# Patient Record
Sex: Female | Born: 1935 | Race: Black or African American | Hispanic: No | Marital: Married | Smoking: Former smoker
Health system: Southern US, Community
[De-identification: ages and names within clinical notes are randomized; demographics above are authoritative.]

## PROBLEM LIST (undated history)

## (undated) DIAGNOSIS — D219 Benign neoplasm of connective and other soft tissue, unspecified: Secondary | ICD-10-CM

## (undated) DIAGNOSIS — K635 Polyp of colon: Secondary | ICD-10-CM

## (undated) HISTORY — DX: Polyp of colon: K63.5

## (undated) HISTORY — DX: Benign neoplasm of connective and other soft tissue, unspecified: D21.9

---

## 1960-02-27 HISTORY — PX: OTHER SURGICAL HISTORY: SHX169

## 1973-02-26 HISTORY — PX: ABDOMINAL HYSTERECTOMY: SHX81

## 1997-12-16 ENCOUNTER — Other Ambulatory Visit: Admission: RE | Admit: 1997-12-16 | Discharge: 1997-12-16 | Payer: Self-pay | Admitting: Obstetrics and Gynecology

## 1999-01-26 ENCOUNTER — Other Ambulatory Visit: Admission: RE | Admit: 1999-01-26 | Discharge: 1999-01-26 | Payer: Self-pay | Admitting: Obstetrics and Gynecology

## 2000-01-29 ENCOUNTER — Other Ambulatory Visit: Admission: RE | Admit: 2000-01-29 | Discharge: 2000-01-29 | Payer: Self-pay | Admitting: Obstetrics and Gynecology

## 2001-02-03 ENCOUNTER — Other Ambulatory Visit: Admission: RE | Admit: 2001-02-03 | Discharge: 2001-02-03 | Payer: Self-pay | Admitting: Obstetrics and Gynecology

## 2002-02-03 ENCOUNTER — Other Ambulatory Visit: Admission: RE | Admit: 2002-02-03 | Discharge: 2002-02-03 | Payer: Self-pay | Admitting: Obstetrics and Gynecology

## 2003-02-23 ENCOUNTER — Other Ambulatory Visit: Admission: RE | Admit: 2003-02-23 | Discharge: 2003-02-23 | Payer: Self-pay | Admitting: Obstetrics and Gynecology

## 2003-03-15 ENCOUNTER — Encounter (INDEPENDENT_AMBULATORY_CARE_PROVIDER_SITE_OTHER): Payer: Self-pay | Admitting: Specialist

## 2003-03-15 ENCOUNTER — Ambulatory Visit (HOSPITAL_COMMUNITY): Admission: RE | Admit: 2003-03-15 | Discharge: 2003-03-15 | Payer: Self-pay | Admitting: Gastroenterology

## 2003-03-19 ENCOUNTER — Ambulatory Visit (HOSPITAL_COMMUNITY): Admission: RE | Admit: 2003-03-19 | Discharge: 2003-03-19 | Payer: Self-pay | Admitting: Gastroenterology

## 2003-04-27 HISTORY — PX: COLON RESECTION: SHX5231

## 2003-05-14 ENCOUNTER — Inpatient Hospital Stay (HOSPITAL_COMMUNITY): Admission: RE | Admit: 2003-05-14 | Discharge: 2003-05-19 | Payer: Self-pay | Admitting: General Surgery

## 2003-05-14 ENCOUNTER — Encounter (INDEPENDENT_AMBULATORY_CARE_PROVIDER_SITE_OTHER): Payer: Self-pay | Admitting: Specialist

## 2004-02-24 ENCOUNTER — Other Ambulatory Visit: Admission: RE | Admit: 2004-02-24 | Discharge: 2004-02-24 | Payer: Self-pay | Admitting: Obstetrics and Gynecology

## 2004-04-10 ENCOUNTER — Encounter (INDEPENDENT_AMBULATORY_CARE_PROVIDER_SITE_OTHER): Payer: Self-pay | Admitting: Specialist

## 2004-04-10 ENCOUNTER — Ambulatory Visit (HOSPITAL_COMMUNITY): Admission: RE | Admit: 2004-04-10 | Discharge: 2004-04-10 | Payer: Self-pay | Admitting: Gastroenterology

## 2005-02-27 ENCOUNTER — Other Ambulatory Visit: Admission: RE | Admit: 2005-02-27 | Discharge: 2005-02-27 | Payer: Self-pay | Admitting: Obstetrics and Gynecology

## 2005-11-24 IMAGING — CT CT ABDOMEN W/ CM
1 series · 15 of 32 positions shown, 19 images · IV contrast (GASTRO & 100 ML OMNI)
Comparison: none

CLINICAL DATA: Villous adenoma in the cecum.
 CT OF THE ABDOMEN WITH CONTRAST
 Multidetector helical scans through the abdomen were performed after oral and IV contrast media were given.  100 cc of Omnipaque 300 were given as a contrast media.  
 The lung bases are clear.  There are multiple low attenuation structures scattered throughout the right and left lobes of the liver most consistent with incidental cysts statistically.  No ductal dilatation is seen.  The pancreas, adrenal glands, and spleen appear normal.  The kidneys enhance normally, and on delayed images the pelvocaliceal systems appear normal.  No adenopathy is seen.  The abdominal aorta is normal in caliber.
 IMPRESSION 
 Negative CT of the abdomen.
 CT PELVIS WITH CONTRAST
 Scans were continued through the pelvis after oral and IV contrast media were given.
 By history, the patient has a villous adenoma in the cecum.  There is minimal soft tissue in the anterolateral aspect of the cecum which may represent this villous adenoma.  However, the pericolonic fat planes are well preserved and no adenopathy is seen within the pelvis.  The urinary bladder is unremarkable.  No pelvic mass or adenopathy is seen in this patient who has previously undergone hysterectomy.  There are degenerative changes involving facet joints in the lower lumbar spine.
 Negative CT of the abdomen.  Minimal soft tissue is noted in the cecum where, by history, the patient has villous adenoma.  No adenopathy is noted.

[Series 2: abd pelvis · axial · 0.73mm/px · z∈[-415,-55]mm · 15 of 112 slices shown, 19 images]
[im 8/112  soft-tissue]
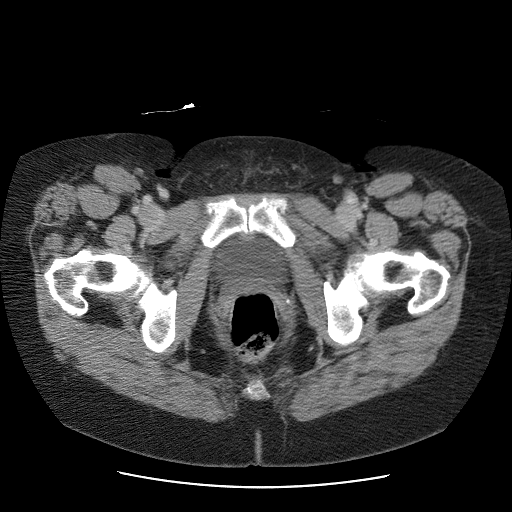
[im 8/112  bone]
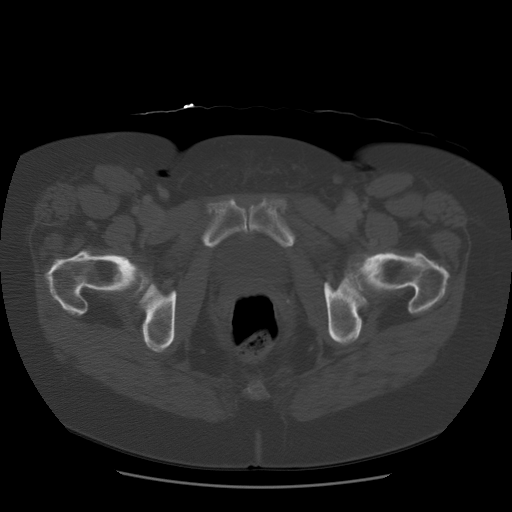
[im 15/112  soft-tissue]
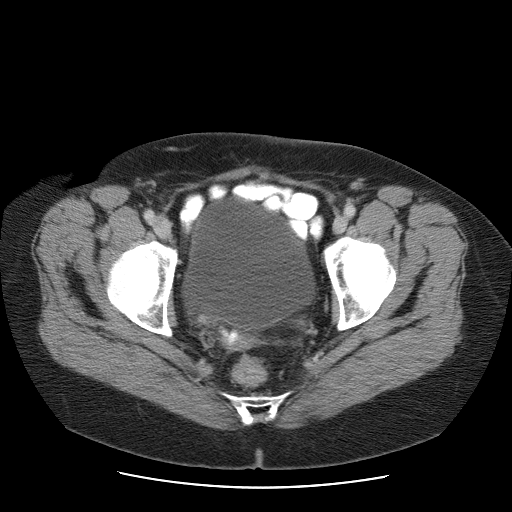
[im 22/112  soft-tissue]
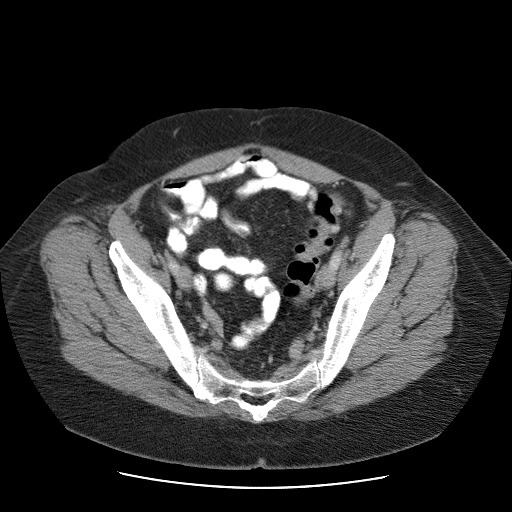
[im 33/112  soft-tissue]
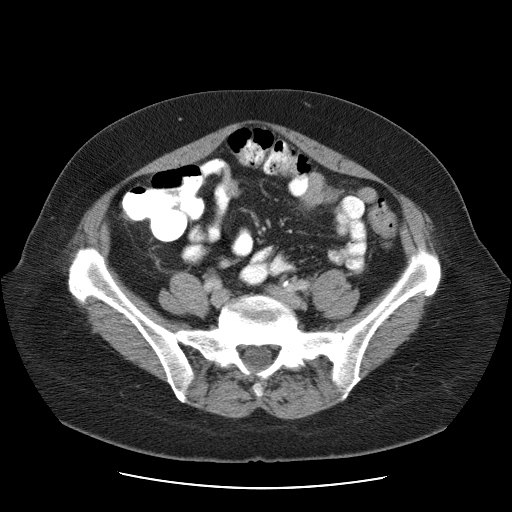
[im 40/112  soft-tissue]
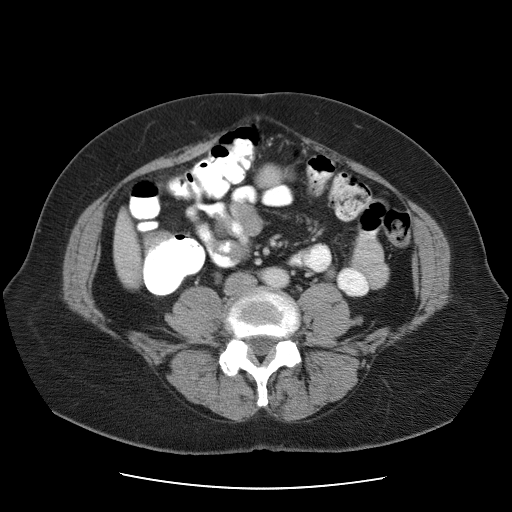
[im 47/112  soft-tissue]
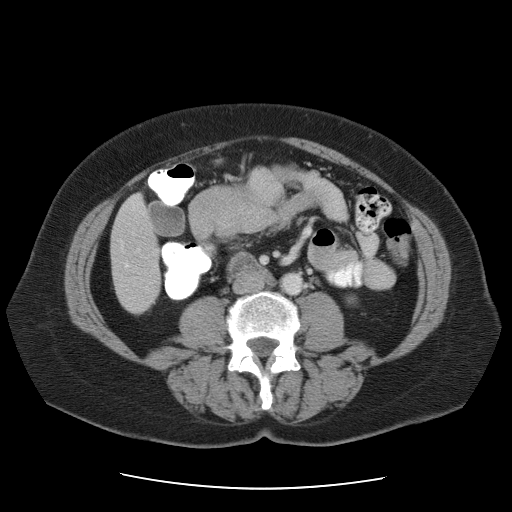
[im 58/112  soft-tissue]
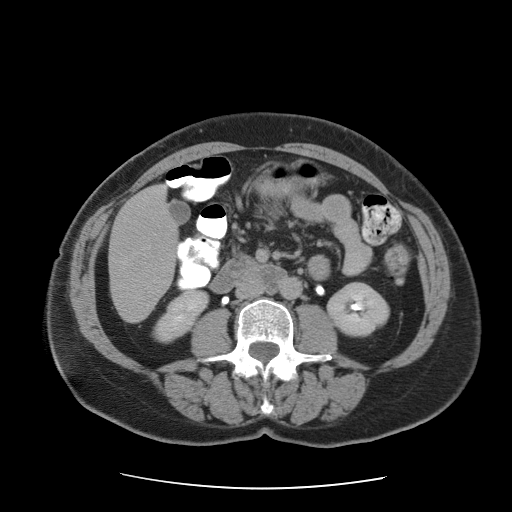
[im 65/112  soft-tissue]
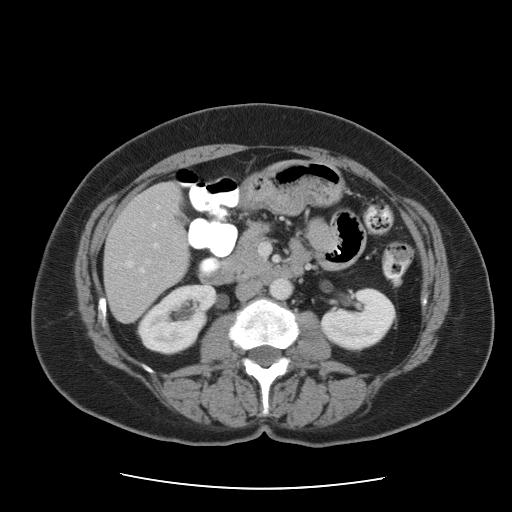
[im 72/112  soft-tissue]
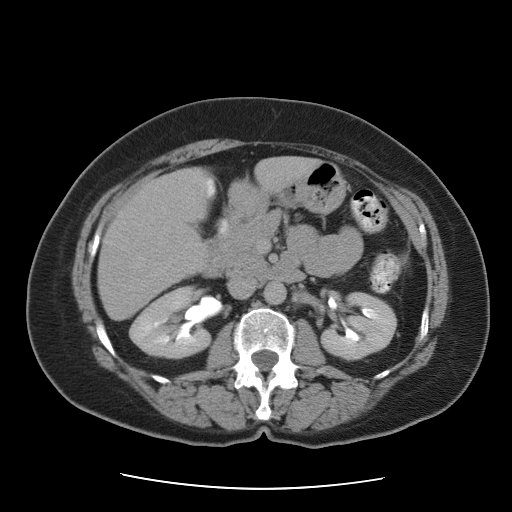
[im 72/112  bone]
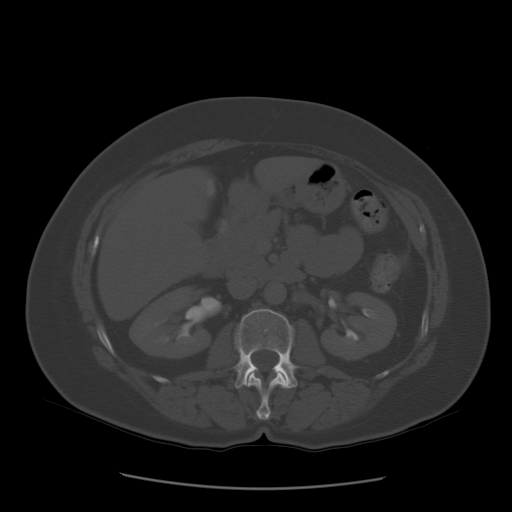
[im 79/112  soft-tissue]
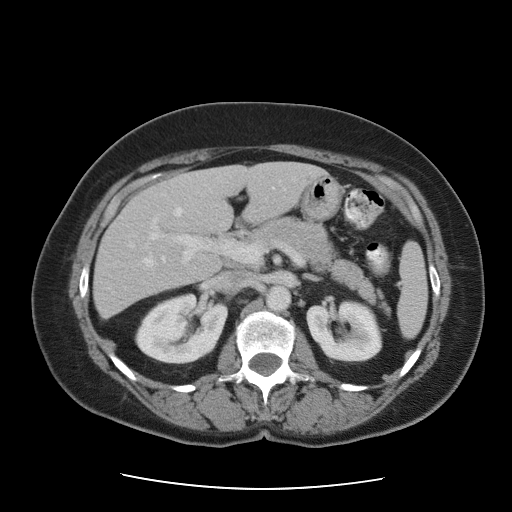
[im 90/112  soft-tissue]
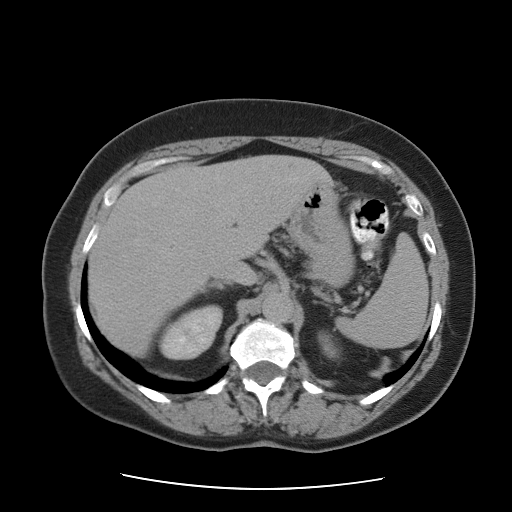
[im 97/112  soft-tissue]
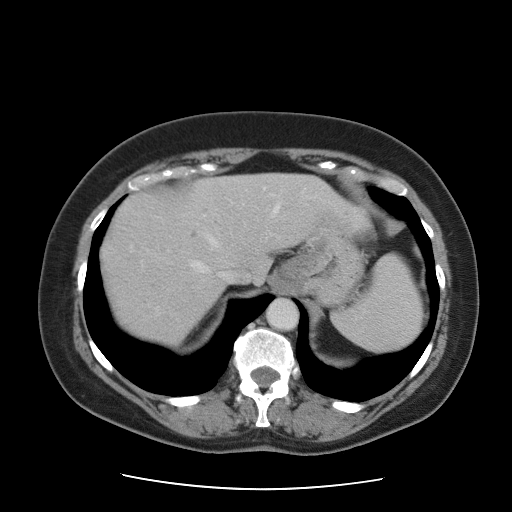
[im 97/112  lung]
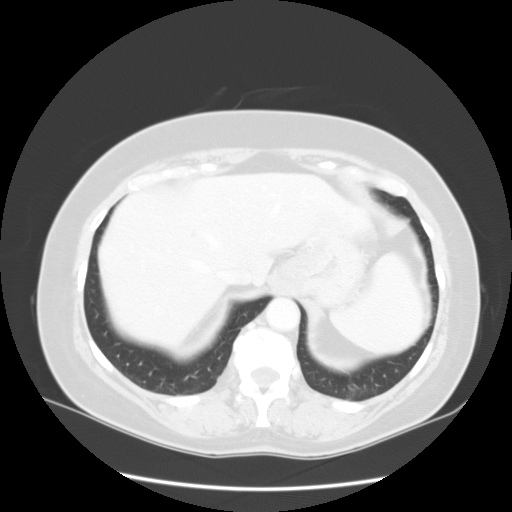
[im 101/112  lung]
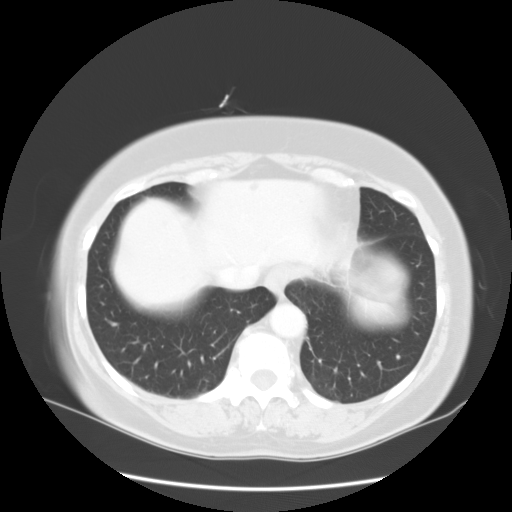
[im 104/112  soft-tissue]
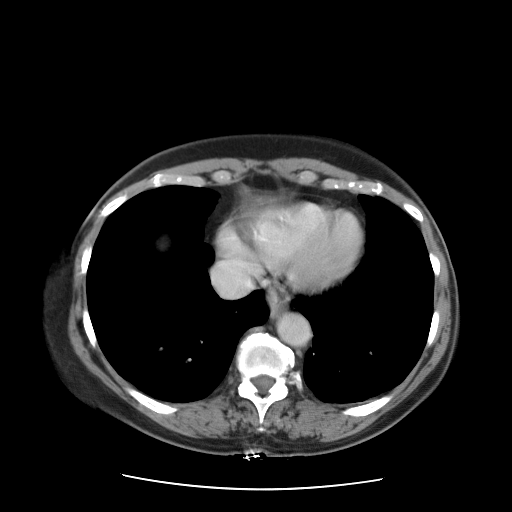
[im 104/112  lung]
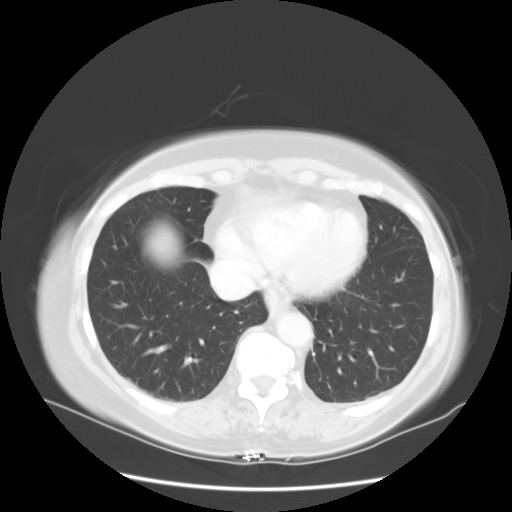
[im 108/112  lung]
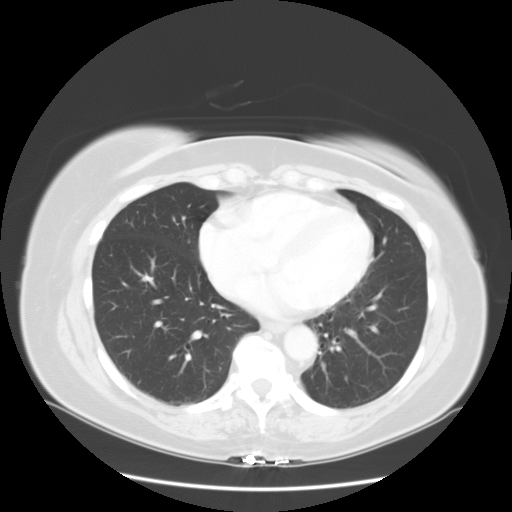

[15 of 32 positions shown; findings below may reference images not displayed]

## 2006-02-28 ENCOUNTER — Other Ambulatory Visit: Admission: RE | Admit: 2006-02-28 | Discharge: 2006-02-28 | Payer: Self-pay | Admitting: Obstetrics and Gynecology

## 2006-04-17 ENCOUNTER — Ambulatory Visit (HOSPITAL_COMMUNITY): Admission: RE | Admit: 2006-04-17 | Discharge: 2006-04-17 | Payer: Self-pay | Admitting: Gastroenterology

## 2007-03-04 ENCOUNTER — Other Ambulatory Visit: Admission: RE | Admit: 2007-03-04 | Discharge: 2007-03-04 | Payer: Self-pay | Admitting: Obstetrics and Gynecology

## 2008-02-18 ENCOUNTER — Emergency Department (HOSPITAL_COMMUNITY): Admission: EM | Admit: 2008-02-18 | Discharge: 2008-02-18 | Payer: Self-pay | Admitting: Emergency Medicine

## 2008-02-19 ENCOUNTER — Emergency Department (HOSPITAL_COMMUNITY): Admission: EM | Admit: 2008-02-19 | Discharge: 2008-02-19 | Payer: Self-pay | Admitting: Family Medicine

## 2008-03-04 ENCOUNTER — Encounter: Payer: Self-pay | Admitting: Obstetrics and Gynecology

## 2008-03-04 ENCOUNTER — Ambulatory Visit: Payer: Self-pay | Admitting: Obstetrics and Gynecology

## 2008-03-04 ENCOUNTER — Other Ambulatory Visit: Admission: RE | Admit: 2008-03-04 | Discharge: 2008-03-04 | Payer: Self-pay | Admitting: Obstetrics and Gynecology

## 2008-08-16 ENCOUNTER — Ambulatory Visit: Payer: Self-pay | Admitting: Obstetrics and Gynecology

## 2009-03-25 ENCOUNTER — Ambulatory Visit: Payer: Self-pay | Admitting: Obstetrics and Gynecology

## 2009-03-25 ENCOUNTER — Other Ambulatory Visit: Admission: RE | Admit: 2009-03-25 | Discharge: 2009-03-25 | Payer: Self-pay | Admitting: Obstetrics and Gynecology

## 2009-05-31 ENCOUNTER — Ambulatory Visit: Payer: Self-pay | Admitting: Obstetrics and Gynecology

## 2010-03-27 ENCOUNTER — Other Ambulatory Visit: Payer: Self-pay | Admitting: Obstetrics and Gynecology

## 2010-03-27 ENCOUNTER — Ambulatory Visit
Admission: RE | Admit: 2010-03-27 | Discharge: 2010-03-27 | Payer: Self-pay | Source: Home / Self Care | Attending: Obstetrics and Gynecology | Admitting: Obstetrics and Gynecology

## 2010-03-27 ENCOUNTER — Other Ambulatory Visit (HOSPITAL_COMMUNITY)
Admission: RE | Admit: 2010-03-27 | Discharge: 2010-03-27 | Disposition: A | Discharge status: Home / Self Care | Payer: BC Managed Care – PPO | Source: Ambulatory Visit | Attending: Obstetrics and Gynecology | Admitting: Obstetrics and Gynecology

## 2010-03-27 DIAGNOSIS — Z124 Encounter for screening for malignant neoplasm of cervix: Secondary | ICD-10-CM | POA: Insufficient documentation

## 2010-07-14 NOTE — Op Note (Signed)
NAME:  Sarah Carr, Sarah Carr                ACCOUNT NO.:  [id number]   MEDICAL RECORD NO.:  [id number]          PATIENT TYPE:  AMB   LOCATION:  ENDO                         FACILITY:  [facility]   PHYSICIAN:  Anselmo Rod, M.D.  DATE OF BIRTH:  21-Oct-1935   DATE OF PROCEDURE:  04/10/2004  DATE OF DISCHARGE:                                 OPERATIVE REPORT   PROCEDURE PERFORMED:  Colonoscopy with biopsies (cold biopsies).   ENDOSCOPIST:  Anselmo Rod, M.D.   INSTRUMENT USED:  Olympus video colonoscope.   INDICATION FOR PROCEDURE:  A 75 year old African-American female with a  history of a tubulovillous adenoma removed from the cecum by right  hemicolectomy done laparoscopically, undergoing repeat colonoscopy to rule  out recurrent polyps.   PREPROCEDURE PREPARATION:  Informed consent was procured from the patient.  The patient was fasted for eight hours prior to the procedure and prepped  with a bottle of magnesium citrate and a gallon of GoLYTELY the night prior  to the procedure.  The risks and benefits of the procedure, including a 10%  miss rate of cancer and polyps, were discussed with the patient as well.   PREPROCEDURE PHYSICAL:  VITAL SIGNS:  The patient had stable vital signs.  NECK:  Supple.  CHEST:  Clear to auscultation.  S1, S2 regular.  ABDOMEN:  Soft with normal bowel sounds, with a well-healed surgical scar  present from previous laparoscopic surgery.   DESCRIPTION OF PROCEDURE:  The patient was placed in the left lateral  decubitus position and sedated with 70 mg of Demerol and 75 mg of Versed in  slow incremental doses.  Once the patient was adequately sedate and  maintained on low-flow oxygen and continuous cardiac monitoring, the Olympus  video colonoscope was advanced from the rectum to the anastomosis without  difficulty.  There was evidence of sigmoid diverticulosis with inspissated  stool in some of the diverticula.  Three small sessile polyps were biopsied  from the rectosigmoid colon (cold biopsies).  The rest of the exam was  unrevealing.  The rest of the anastomosis appeared healthy until the distal  small bowel.   IMPRESSION:  1.  Sigmoid diverticulosis.  2.  Three small sessile polyps removed by cold biopsy from rectosigmoid      colon.  3.  Healthy anastomosis with normal distal small bowel.   RECOMMENDATIONS:  1.  Await pathology results.  2.  Repeat colonoscopy depending on pathology results.  3.  A high-fiber diet with liberal fluid intake has been advocated,      brochures on diverticulosis have been given to the patient for her      education.  4.  Outpatient follow-up as the need arises in the future.      JNM/MEDQ  D:  04/10/2004  T:  04/10/2004  Job:  [id number]   cc:   Lorelle Formosa, M.D.  [phone]  [place]  [place] [address]  Fax: [phone]   Adolph Pollack, M.D.  [address], [address]  [place]  [place] [address]

## 2010-07-14 NOTE — H&P (Signed)
Sarah Carr, Sarah Carr                            ACCOUNT NO.:  [id number]   MEDICAL RECORD NO.:  [id number]                   PATIENT TYPE:  INP   LOCATION:  0480                                 FACILITY:  [facility]   PHYSICIAN:  Adolph Pollack, M.D.            DATE OF BIRTH:  1935-11-13   DATE OF ADMISSION:  05/14/2003  DATE OF DISCHARGE:                                HISTORY & PHYSICAL   REASON FOR ADMISSION:  Elective partial colectomy.   HISTORY OF PRESENT ILLNESS:  Sarah Carr is a 75 year old female who  underwent a screening colonoscopy.  A nodular lesion in the cecum was  identified and a biopsy was performed.  It demonstrated some tubulovillous  adenoma. However, it was not able to be completely removed and it was  recommended she undergo partial colectomy.  She thus presents for that at  this time.   PAST MEDICAL HISTORY:  No chronic illnesses.   PAST SURGICAL HISTORY:  Cesarean section, partial hysterectomy, knee  surgery.   ALLERGIES:  PENICILLIN causes hives.   MEDICATIONS:  She is on no prescription medications but does take vitamins.   SOCIAL HISTORY:  No tobacco use. Occasional alcohol use. 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   FAMILY HISTORY:  Positive for heart disease.   REVIEW OF SYMPTOMS:  No cardiac or pulmonary problems.  No significant  pertinent positives.   PHYSICAL EXAMINATION:  GENERAL:  A well-developed, well-nourished female who  is in no acute distress. She is very pleasant and cooperative.  NECK:  Supple without palpable masses.  RESPIRATORY:  Breath sounds equal and clear.  Respirations are unlabored.  CARDIOVASCULAR:  Heart demonstrates a regular rate and rhythm.  There are no  murmurs heard. No JVD noted.  ABDOMEN:  Soft.  There is a small lower midline scar present without  hernias.  No palpable masses or organomegaly noted.  EXTREMITIES:  PAS hose are on here in the holding room.   IMPRESSION:   Tubulovillous adenoma of the colon that is incompletely  removed.   PLAN:  Laparoscopic assisted right colectomy. The procedure and the risks  which had been discussed with her she seems to understand and agrees to  proceed.                                              Adolph Pollack, M.D.   Sarah Carr  D:  05/14/2003  T:  05/15/2003  Job:  [id number]

## 2010-07-14 NOTE — Discharge Summary (Signed)
NAMEELISAMA, Sarah Carr                            ACCOUNT NO.:  [id number]   MEDICAL RECORD NO.:  [id number]                   PATIENT TYPE:  INP   LOCATION:  0480                                 FACILITY:  [facility]   PHYSICIAN:  Adolph Pollack, M.D.            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   SECONDARY DIAGNOSIS:  Mild postoperative ileus.   REASON FOR ADMISSION:  This is a 75 year old female who underwent a  screening colonoscopy and a nodular lesion in the cecum was noted and  biopsied.  There was some tubulovillous changes consistent with  tubulovillous adenoma.  However, it was unable to removed completely and she  now presents for elective resection.   HOSPITAL COURSE:  She underwent a laparoscopic assisted right colectomy  which she tolerated well.  Her Foley was removed.  She had a mild ileus, but  was able to be started on a diet and advanced along.  Her ileus resolved by  her fourth postoperative day with bowel movements and flatus.  Incision was  clean and intact.  By her fifth postoperative day the pathology came back as  above with no evidence of malignancy.  She was stable and ready for  discharge.   DISPOSITION:  Discharged to home on May 19, 2003, in satisfactory  condition.   ACTIVITY:  She was given activity restrictions.   DISCHARGE MEDICATIONS:  Tylox for pain.   FOLLOWUP:  She is going to return to see me in about five days for staple  removal.                                               Adolph Pollack, M.D.    [name]  D:  05/31/2003  T:  06/01/2003  Job:  [id number]   cc:   Anselmo Rod, M.D.  [address]  [address]  [place]  [place] [address]  Fax: [phone]   Lorelle Formosa, M.D.  [phone] [address]  [place]  [place] [address]  Fax: [phone]

## 2010-07-14 NOTE — Op Note (Signed)
Sarah Carr, Sarah Carr                            ACCOUNT NO.:  [id number]   MEDICAL RECORD NO.:  [id number]                   PATIENT TYPE:  INP   LOCATION:  0003                                 FACILITY:  [facility]   PHYSICIAN:  Adolph Pollack, M.D.            DATE OF BIRTH:  [birth year]   DATE OF PROCEDURE:  05/14/2003  DATE OF DISCHARGE:                                 OPERATIVE REPORT   PREOPERATIVE DIAGNOSIS:  Tubovillous adenoma of the cecum.   POSTOPERATIVE DIAGNOSIS:  Tubovillous adenoma of the cecum.   OPERATION/PROCEDURE:  Laparoscopic-assisted right colectomy.   SURGEON:  Adolph Pollack, M.D.   ASSISTANT:  Abigail Miyamoto, M.D.   ANESTHESIA:  General.   INDICATIONS:  This is a 75 year old female who underwent a screening  colonoscopy and was found to have a nodular lesion in the cecum that could  only be biopsied but not completely removed.  It demonstrated tubulovillous  adenoma.  Partial colectomy was recommended to her and she is now here for  that.   DESCRIPTION OF PROCEDURE:  She was seen in the holding area and then brought  to the operating room, placed supine on the operating room table and general  anesthesia was administered.  A Foley catheter was placed in the bladder.  The abdominal wall was then sterilely prepped and draped.  In the epigastric  region, a small incision was made just in the midline through the skin and  subcutaneous tissue to the midline fascia was identified and small incision  made in it.  Pursestring suture of 0 Vicryl was placed around the fascial  edges.  Using blunt dissection, the peritoneal cavity was entered and a  Hasson trocar was introduced into the peritoneal cavity.  A pneumoperitoneum  was then created by insufflation of CO2 gas.  A 30-degree laparoscope was  introduced.  Under direct vision a 5 mm trocar was placed in the left lower  mid abdominal wall and one in the lower mid to a part of the previous  incision.  I  identified the cecum.  I mobilized the cecum in the right colon  by dividing its lateral attachments with the Harmonic scalpel using careful  blunt dissection to sweep it medially.  Hepatic flexure was then approached  and it was mobilized using the Harmonic scalpel and divide the attachments.  I could mobilize all the way up to approximately the mid transverse colon.  It was mobile enough to be able to pulled down to the infraumbilical area.  I did not notice any liver lesions.   I then made an extraction site by extending the 5 mm incision in the lower  midline through the previous incision she had.  I opened this incision up  and entered the peritoneal cavity.  I then grasped the cecum and pulled the  cecum off the distal ileum.  I transected the distal ileum just proximal to  the ileocecal valve with an endo-GIA stapler.  I exteriorized the proximal  third of the transverse colon and divided the transverse colon just distal  to the hepatic flexure with the GIA stapler.  The mesenteric vessels were  then divided between clamps and  the vessels ligated.  A wedge of mesentery  was included with the specimen which was handed off the field.   Next, a side-to-side stapled anastomosis was performed between the distal  ileal area and the transverse colon by using the GIA stapler.  The remaining  opening was closed with a linear, noncutting stapler.  The anastomosis was  patent, viable and under no tension.  The distal aspect of the staple line  was reinforced with a single 3-0 Vicryl suture.   Gloves were then changed.  The abdominal cavity was irrigated and the fluid  was clear.  After needle, sponge and instrument counts were reported to be  correct for the first time, I closed the extraction site incision in the  lower midline.  The fascia was closed with a running #1 PDS suture.  Subcutaneous tissue was irrigated and skin was closed with staples.   I then reinsufflated the abdomen and  examined the area.  No bleeding was  noted and closure in the lower mid was solid.  The laparoscopic was removed,  the pneumoperitoneum released.  The trocars were removed.  The epigastric  fascial defect was closed by tightening up and tying down the pursestring  suture and all skin incisions were closed with staples.  Sterile dressings  were applied.   She tolerated the procedure well without any apparent complications.  She  was taken to the recovery room in satisfactory condition.                                               Adolph Pollack, M.D.    [name]  D:  05/14/2003  T:  05/14/2003  Job:  [id number]   cc:   Anselmo Rod, M.D.  [address]  [address]  [place]  [place] [address]  Fax: [phone]

## 2010-07-14 NOTE — Op Note (Signed)
NAME:  Sarah Carr, Sarah Carr                            ACCOUNT NO.:  [id number]   MEDICAL RECORD NO.:  [id number]                   PATIENT TYPE:  AMB   LOCATION:  ENDO                                 FACILITY:  [facility]   PHYSICIAN:  Anselmo Rod, M.D.               DATE OF BIRTH:  12/29/1935   DATE OF PROCEDURE:  03/16/2003  DATE OF DISCHARGE:                                 OPERATIVE REPORT   PROCEDURE:  Screening colonoscopy with biopsies.   ENDOSCOPIST:  Charna Elizabeth, M.D.   INSTRUMENT USED:  Olympus video colonoscope.   INDICATIONS FOR PROCEDURE:  75 year old African American female undergoing  screening colonoscopy to rule out colonic polyps, masses, etc.   PREPROCEDURE PREPARATION:  Informed consent was obtained from the patient.  The patient was fasted for eight hours prior to the procedure and prepped  with a bottle of magnesium citrate and a gallon of GoLYTELY the night prior  to the procedure.   PREPROCEDURE PHYSICAL:  Patient with stable vital signs.  Neck supple.  Chest clear to auscultation.  S1 and S2 regular.  Abdomen soft with normal  bowel sounds.   DESCRIPTION OF PROCEDURE:  The patient was placed in the left lateral  decubitus position, sedated with 60 mg of Demerol and 6 mg Versed  intravenously.  Once the patient was adequately sedated, maintained on low  flow oxygen and continuous cardiac monitoring, the Olympus video colonoscope  was advanced from the rectum to the cecum.  A nodular flat mass was seen in  the cecum.  Multiple biopsies were done to rule out villous adenoma.  There  were a few sigmoid diverticula noted and small internal hemorrhoids seen on  retroflexion.  The transverse colon appeared healthy and without lesions.  The patient tolerated the procedure well without complications.  The  appendiceal orifice and ileocecal valve were clearly visualized and  photographed.   IMPRESSION:  1. Small nonbleeding internal hemorrhoids.  2. Sigmoid  diverticulosis.  3. Flat, nodular lesion in the cecum, question villous adenoma, multiple     biopsies done.   RECOMMENDATIONS:  1. Await pathology results, surgical evaluation for right hemicolectomy with     Dr. Abigail Miyamoto.  2. High fiber diet with liberal fluid intake.  3. Brochures on diverticulosis have been given to the patient.  4. Further recommendations will be made in follow up.                                               Anselmo Rod, M.D.    JNM/MEDQ  D:  03/16/2003  T:  03/16/2003  Job:  [id number]   cc:   Romualdo Bolk, M.D.   Abigail Miyamoto, M.D.  [address]  [place]  [place] [address]  Fax: [phone]

## 2010-07-14 NOTE — Op Note (Signed)
NAME:  Sarah Carr, Sarah Carr                ACCOUNT NO.:  [id number]   MEDICAL RECORD NO.:  [id number]          PATIENT TYPE:  AMB   LOCATION:  ENDO                         FACILITY:  [facility]   PHYSICIAN:  Anselmo Rod, M.D.  DATE OF BIRTH:  February 19, 1936   DATE OF PROCEDURE:  04/17/2006  DATE OF DISCHARGE:                               OPERATIVE REPORT   PROCEDURE:  Colonoscopy with snare polypectomy x1.   ENDOSCOPIST:  Anselmo Rod, M.D.   INSTRUMENT USED:  Pentax video colonoscope.   INDICATIONS FOR PROCEDURE:  This 75 year old African/American female  with a history of a tubulovillous adenoma (cecal mass), removed by a  right hemicolectomy in 2005, undergoing a surveillance colonoscopy to  rule out colonic polyps, masses, etc.   PRE-PROCEDURE PREPARATION:  An informed consent was procured from the  patient.  The patient fasted for four hours prior to the procedure and  was prepped with 20 ounces of prep during the night and 12 ounces of  prep on the morning of the procedure.  The risks and benefits of the  procedure, including a 10% mid-rate of polyp were discussed with the  patient as well.   PRE-PROCEDURE PHYSICAL EXAMINATION:  VITAL SIGNS:  Stable.  NECK:  Supple.  CHEST:  Clear to auscultation.  HEART:  S1 and S2 regular.  ABDOMEN:  Soft, normal bowel sounds.   DESCRIPTION OF PROCEDURE:  The patient was placed in the left lateral  decubitus position and sedated with 60 mcg of fentanyl and 5 mg of  Versed given intravenously in slow incremental doses. Once the patient  was adequately sedated and maintained on low-flow oxygen and continuous  cardiac monitoring, the Pentax video colonoscope was advanced from the  rectum to the anastomosis at 100 cm, without difficulty.  There was some  residual stool in the colon.  Multiple washings were done.  Sigmoid  diverticulosis was noted.  A small sessile polyp was snared from 30 cm.  This polyp was lost in stool.  The rest of the  examination was  unremarkable.  Retroflexion in the rectum revealed no abnormalities.   The patient tolerated the procedure well without any immediate  complications.   IMPRESSION:  1. Small sessile polyp removed by a hard snare from 30 cm.  2. Sigmoid diverticulosis.  3. Status post right hemicolectomy; a healthy anastomosis noted at 100      cm.   RECOMMENDATIONS:  1. Repeat colonoscopy has been recommended in the next five years.  2. If the patient has any abnormal symptoms in the interim, she should      contact the office immediately for further recommendations.  3. A high fiber diet.  Brochures on diverticulosis have been given to      the patient for education.  4. Avoid all non-steroidal anti-inflammatories including aspirin, for      the next two weeks.  5. Outpatient followup as the need arises in the future.      Anselmo Rod, M.D.  Electronically Signed     JNM/MEDQ  D:  04/17/2006  T:  04/17/2006  Job:  [id number]   cc:   Adolph Pollack, M.D.  Lorelle Formosa, M.D.  Daniel L. Eda Paschal, M.D.

## 2010-08-03 ENCOUNTER — Ambulatory Visit (HOSPITAL_COMMUNITY)
Admission: RE | Admit: 2010-08-03 | Discharge: 2010-08-03 | Disposition: A | Discharge status: Home / Self Care | Payer: BC Managed Care – PPO | Source: Ambulatory Visit | Attending: Family Medicine | Admitting: Family Medicine

## 2010-08-03 ENCOUNTER — Other Ambulatory Visit (HOSPITAL_COMMUNITY): Payer: Self-pay | Admitting: Family Medicine

## 2010-08-03 DIAGNOSIS — W19XXXA Unspecified fall, initial encounter: Secondary | ICD-10-CM

## 2010-08-03 DIAGNOSIS — M19049 Primary osteoarthritis, unspecified hand: Secondary | ICD-10-CM | POA: Insufficient documentation

## 2010-08-03 DIAGNOSIS — M25539 Pain in unspecified wrist: Secondary | ICD-10-CM | POA: Insufficient documentation

## 2010-11-18 ENCOUNTER — Inpatient Hospital Stay (INDEPENDENT_AMBULATORY_CARE_PROVIDER_SITE_OTHER)
Admission: RE | Admit: 2010-11-18 | Discharge: 2010-11-18 | Disposition: A | Discharge status: Home / Self Care | Payer: BC Managed Care – PPO | Source: Ambulatory Visit | Attending: Family Medicine | Admitting: Family Medicine

## 2010-11-18 ENCOUNTER — Ambulatory Visit (INDEPENDENT_AMBULATORY_CARE_PROVIDER_SITE_OTHER): Payer: BC Managed Care – PPO

## 2010-11-18 DIAGNOSIS — M799 Soft tissue disorder, unspecified: Secondary | ICD-10-CM

## 2011-03-20 DIAGNOSIS — D219 Benign neoplasm of connective and other soft tissue, unspecified: Secondary | ICD-10-CM | POA: Insufficient documentation

## 2011-03-23 ENCOUNTER — Encounter: Payer: Self-pay | Admitting: Obstetrics and Gynecology

## 2011-04-12 ENCOUNTER — Encounter: Payer: Self-pay | Admitting: Obstetrics and Gynecology

## 2011-04-12 ENCOUNTER — Other Ambulatory Visit: Payer: Self-pay | Admitting: Obstetrics and Gynecology

## 2011-04-12 ENCOUNTER — Other Ambulatory Visit (HOSPITAL_COMMUNITY)
Admission: RE | Admit: 2011-04-12 | Discharge: 2011-04-12 | Disposition: A | Discharge status: Home / Self Care | Payer: BC Managed Care – PPO | Source: Ambulatory Visit | Attending: Obstetrics and Gynecology | Admitting: Obstetrics and Gynecology

## 2011-04-12 ENCOUNTER — Ambulatory Visit (INDEPENDENT_AMBULATORY_CARE_PROVIDER_SITE_OTHER): Payer: BC Managed Care – PPO | Admitting: Obstetrics and Gynecology

## 2011-04-12 VITALS — BP 120/76 | Ht 63.5 in | Wt 182.0 lb

## 2011-04-12 DIAGNOSIS — Z124 Encounter for screening for malignant neoplasm of cervix: Secondary | ICD-10-CM | POA: Insufficient documentation

## 2011-04-12 DIAGNOSIS — K635 Polyp of colon: Secondary | ICD-10-CM | POA: Insufficient documentation

## 2011-04-12 DIAGNOSIS — Z01419 Encounter for gynecological examination (general) (routine) without abnormal findings: Secondary | ICD-10-CM

## 2011-04-12 LAB — URINALYSIS W MICROSCOPIC + REFLEX CULTURE
Bilirubin Urine: NEGATIVE
Casts: NONE SEEN
Glucose, UA: NEGATIVE mg/dL
Protein, ur: NEGATIVE mg/dL
Urobilinogen, UA: 0.2 mg/dL (ref 0.0–1.0)
pH: 5.5 (ref 5.0–8.0)

## 2011-04-12 NOTE — Progress Notes (Signed)
Patient came to see me today for her annual GYN exam. She is up-to-date on mammograms and bone densities. She is having no gynecological problems. All her lab work was normal last year. She has had no non-GYN issues. She is scheduled for colonoscopy. She had Doppler of her carotids and her abdomen for like screening yesterday. She is having no pelvic pain. She is having no vaginal bleeding.  ROS: 12 system review done. All is normal.  Physical examination: Kennon Portela present HEENT within normal limits. Neck: Thyroid not large. No masses. Supraclavicular nodes: not enlarged. Breasts: Examined in both sitting midline position. No skin changes and no masses. Abdomen: Soft no guarding rebound or masses or hernia. Pelvic: External: Within normal limits. BUS: Within normal limits. Vaginal:within normal limits. Good estrogen effect. No evidence of cystocele rectocele or enterocele. Cervix: clean. Uterus: Normal size and shape. Adnexa: No masses. Rectovaginal exam: Confirmatory and negative. Extremities: Within normal limits.  Assessment: Normal GYN exam  Plan: Continue yearly mammogram.

## 2011-04-14 LAB — URINE CULTURE
Colony Count: NO GROWTH
Organism ID, Bacteria: NO GROWTH

## 2012-04-15 ENCOUNTER — Encounter: Payer: Self-pay | Admitting: Obstetrics and Gynecology

## 2012-04-15 ENCOUNTER — Ambulatory Visit: Payer: BC Managed Care – PPO | Admitting: Obstetrics and Gynecology

## 2012-04-15 VITALS — BP 100/60 | HR 68 | Ht 63.75 in | Wt 171.0 lb

## 2012-04-15 DIAGNOSIS — Z833 Family history of diabetes mellitus: Secondary | ICD-10-CM

## 2012-04-15 DIAGNOSIS — Z9049 Acquired absence of other specified parts of digestive tract: Secondary | ICD-10-CM

## 2012-04-15 DIAGNOSIS — R350 Frequency of micturition: Secondary | ICD-10-CM

## 2012-04-15 DIAGNOSIS — K635 Polyp of colon: Secondary | ICD-10-CM

## 2012-04-15 DIAGNOSIS — R3129 Other microscopic hematuria: Secondary | ICD-10-CM

## 2012-04-15 DIAGNOSIS — Z8249 Family history of ischemic heart disease and other diseases of the circulatory system: Secondary | ICD-10-CM

## 2012-04-15 LAB — POCT URINALYSIS DIPSTICK
Ketones, UA: NEGATIVE
Leukocytes, UA: NEGATIVE
Nitrite, UA: NEGATIVE
Protein, UA: NEGATIVE
Spec Grav, UA: 1.01
pH, UA: 5

## 2012-04-15 NOTE — Progress Notes (Signed)
Subjective:  NEW PT AEXLast Pap: 02/2011 WNL: Yes Regular Periods:no Contraception: hysterectomy/postmenopausal   Monthly Breast exam:no some months Tetanus<60yrs:no Nl.Bladder Function: leaking of urine with exercise.  Urinary frequency.  Nocturia X2 Daily BMs:yes Healthy Diet:yes Calcium:yes Mammogram:yes Date of Mammogram: 02/2012 per pt Exercise:yes Have often Exercise: twice per week  Seatbelt: yes Abuse at home: no Stressful work:no Sigmoid-colonoscopy: 2013 per pt Bone Density: Yes ? DATE PCP: Dr. Billee Cashing  Change in PMH: n/a Change in FMH:n/a   Sarah Carr is a 77 y.o. female [phone] who presents for annual exam.  The patient has no complaints today.   The following portions of the patient's history were reviewed and updated as appropriate: allergies, current medications, past family history, past medical history, past social history, past surgical history and problem list.  Review of Systems Pertinent items are noted in HPI. Gastrointestinal:No change in bowel habits, no abdominal pain, no rectal bleeding Genitourinary:negative for dysuria, hematuria, Has frequency (especially with current large water intake ) as well asnocturia and urinary incontinence  with exercise.   Objective:     BP 100/60  Pulse 68  Ht 5' 3.75" (1.619 m)  Wt 171 lb (77.565 kg)  BMI 29.59 kg/m2  Weight:  Wt Readings from Last 1 Encounters:  04/15/12 171 lb (77.565 kg)     BMI: Body mass index is 29.59 kg/(m^2). General Appearance: Alert, appropriate appearance for age. No acute distress HEENT: Grossly normal Neck / Thyroid: Supple, no masses, nodes or enlargement Lungs: clear to auscultation bilaterally Back: No CVA tenderness Breast Exam: No masses or nodes.No dimpling, nipple retraction or discharge. Cardiovascular: Regular rate and rhythm. S1, S2, no murmur Gastrointestinal: Soft, non-tender, no masses or organomegaly Pelvic Exam: External genitalia: normal general  appearance Vaginal: atrophic mucosa and vaginal vault, well healed and suspended Cervix: removed surgically Adnexa: non palpable Uterus: removed surgically Rectovaginal: normal rectal, no masses Lymphatic Exam: Non-palpable nodes in neck, clavicular, axillary, or inguinal regions Skin: no rash or abnormalities Neurologic: Normal gait and speech, no tremor  Psychiatric: Alert and oriented, appropriate affect.    Urinalysis:2+ blood    Assessment:    Menopause Microscopic hematuria  Frequency, incontinence R/o UTI Family hx heart dz S/p partial colon resection for polyps   Plan:   mammogram counseled on breast self exam, mammography screening and osteoporosis Needs DXA Q2 years RTO for fasting CMP, CBC, Lipids return annually or prn    Dierdre Forth MD

## 2012-04-16 ENCOUNTER — Other Ambulatory Visit: Payer: BC Managed Care – PPO

## 2012-04-16 LAB — LIPID PANEL
Cholesterol: 195 mg/dL (ref 0–200)
HDL: 63 mg/dL (ref >39)
LDL Cholesterol: 124 mg/dL — ABNORMAL HIGH (ref 0–99)
Total CHOL/HDL Ratio: 3.1 Ratio
VLDL: 8 mg/dL (ref 0–40)

## 2012-04-16 LAB — COMPREHENSIVE METABOLIC PANEL
ALT: 11 U/L (ref 0–35)
AST: 18 U/L (ref 0–37)
Alkaline Phosphatase: 76 U/L (ref 39–117)
BUN: 17 mg/dL (ref 6–23)
CO2: 28 mEq/L (ref 19–32)
Calcium: 9 mg/dL (ref 8.4–10.5)
Chloride: 105 mEq/L (ref 96–112)
Creat: 0.92 mg/dL (ref 0.50–1.10)
Glucose, Bld: 97 mg/dL (ref 70–99)
Sodium: 139 mEq/L (ref 135–145)
Total Bilirubin: 0.5 mg/dL (ref 0.3–1.2)
Total Protein: 6.3 g/dL (ref 6.0–8.3)

## 2012-04-16 LAB — CBC
HCT: 38.9 % (ref 36.0–46.0)
Hemoglobin: 13.2 g/dL (ref 12.0–15.0)
MCH: 28.6 pg (ref 26.0–34.0)
MCV: 84.2 fL (ref 78.0–100.0)
Platelets: 233 10*3/uL (ref 150–400)
RBC: 4.62 MIL/uL (ref 3.87–5.11)
RDW: 14 % (ref 11.5–15.5)

## 2012-04-17 LAB — URINE CULTURE: Organism ID, Bacteria: NO GROWTH

## 2012-04-24 ENCOUNTER — Encounter: Payer: Self-pay | Admitting: Obstetrics and Gynecology

## 2012-04-24 DIAGNOSIS — R922 Inconclusive mammogram: Secondary | ICD-10-CM

## 2012-10-01 ENCOUNTER — Other Ambulatory Visit: Payer: Self-pay

## 2013-01-01 ENCOUNTER — Other Ambulatory Visit: Payer: Self-pay

## 2013-12-11 ENCOUNTER — Other Ambulatory Visit: Payer: Self-pay

## 2013-12-28 ENCOUNTER — Encounter: Payer: Self-pay | Admitting: Obstetrics and Gynecology

## 2014-07-04 ENCOUNTER — Encounter (HOSPITAL_BASED_OUTPATIENT_CLINIC_OR_DEPARTMENT_OTHER): Payer: BC Managed Care – PPO

## 2014-08-23 ENCOUNTER — Other Ambulatory Visit: Payer: Self-pay

## 2014-09-08 ENCOUNTER — Encounter (HOSPITAL_BASED_OUTPATIENT_CLINIC_OR_DEPARTMENT_OTHER): Payer: BC Managed Care – PPO

## 2014-09-13 ENCOUNTER — Encounter (HOSPITAL_BASED_OUTPATIENT_CLINIC_OR_DEPARTMENT_OTHER): Payer: BC Managed Care – PPO

## 2014-11-24 ENCOUNTER — Ambulatory Visit (HOSPITAL_BASED_OUTPATIENT_CLINIC_OR_DEPARTMENT_OTHER): Payer: BC Managed Care – PPO | Attending: Family Medicine | Admitting: Radiology

## 2014-11-24 VITALS — Ht 65.0 in | Wt 163.0 lb

## 2014-11-24 DIAGNOSIS — G4730 Sleep apnea, unspecified: Secondary | ICD-10-CM

## 2014-11-25 NOTE — Sleep Study (Unsigned)
Pt was sick with a cold and stuffy nose and went home.

## 2017-05-16 ENCOUNTER — Other Ambulatory Visit: Payer: Self-pay

## 2017-05-16 ENCOUNTER — Ambulatory Visit (HOSPITAL_COMMUNITY)
Admission: EM | Admit: 2017-05-16 | Discharge: 2017-05-16 | Disposition: A | Discharge status: Home / Self Care | Payer: BC Managed Care – PPO | Attending: Family Medicine | Admitting: Family Medicine

## 2017-05-16 ENCOUNTER — Encounter (HOSPITAL_COMMUNITY): Payer: Self-pay | Admitting: Emergency Medicine

## 2017-05-16 DIAGNOSIS — J069 Acute upper respiratory infection, unspecified: Secondary | ICD-10-CM | POA: Diagnosis not present

## 2017-05-16 MED ORDER — SALINE SPRAY 0.65 % NA SOLN: 1.0000 | NASAL | 0 refills | Status: DC | PRN

## 2017-05-16 MED ORDER — FLUTICASONE PROPIONATE 50 MCG/ACT NA SUSP: 1.0000 | Freq: Every day | NASAL | 2 refills | Status: DC

## 2017-05-16 MED ORDER — ACETAMINOPHEN 325 MG PO TABS
975.0000 mg | ORAL_TABLET | Freq: Once | ORAL | Status: AC
  Administered 2017-05-16: 975 mg via ORAL

## 2017-05-16 MED ORDER — ACETAMINOPHEN 325 MG PO TABS
ORAL_TABLET | ORAL | Status: AC
  Filled 2017-05-16: qty 3

## 2017-05-16 MED ORDER — GUAIFENESIN ER 600 MG PO TB12: 1200.0000 mg | ORAL_TABLET | Freq: Two times a day (BID) | ORAL | 0 refills | Status: DC

## 2017-05-16 NOTE — ED Provider Notes (Signed)
[place]    CSN: [id number] Arrival date & time: 05/16/17  9563     History   Chief Complaint Chief Complaint  Patient presents with  . URI    HPI Sarah Carr is a 82 y.o. female.   Ajanae presents with complaints of sore throat, runny nose, ear popping, headache which started a few days ago, worse yesterday. ocasional cough.  No known ill contacts. No known fevers. Denies gi/gu complaints. Eating and drinking. Took theraflu last night which did seem to help. Without contributing medical history.   ROS per HPI.      Past Medical History:  Diagnosis Date  . Colon polyp   . Fibroid     Patient Active Problem List   Diagnosis Date Noted  . Dense breasts 04/24/2012  . S/P partial resection of colon 04/15/2012  . Family history of ischemic heart disease 04/15/2012  . Family history of diabetes mellitus 04/15/2012  . Colon polyp   . Fibroid     Past Surgical History:  Procedure Laterality Date  . ABDOMINAL HYSTERECTOMY  1975   C-SECTION  . CESAREAN SECTION  1975  . COLON RESECTION  04/2003   BENIGN POLYP  . EXCISION OF BONE TUMOR  1962    OB History    Gravida  4   Para  4   Term  4   Preterm      AB  0   Living  3     SAB      TAB      Ectopic      Multiple      Live Births               Home Medications    Prior to Admission medications   Medication Sig Start Date End Date Taking? Authorizing Provider  Calcium Carbonate-Vitamin D (CALCIUM + D PO) Take by mouth.   Yes [provider]  Cholecalciferol (VITAMIN D) 2000 UNITS CAPS Take by mouth.   Yes [provider]  Coenzyme Q10 (COQ-10 PO) Take by mouth.   Yes [provider]  LUMIGAN 0.01 % SOLN Apply 1 drop to eye Nightly. 03/06/17  Yes [provider]  Multiple Vitamin (MULTIVITAMIN) capsule Take 1 capsule by mouth daily.   Yes [provider]  fluticasone (FLONASE) 50 MCG/ACT nasal spray Place 1 spray into both  nostrils daily. 05/16/17   Zigmund Gottron, NP  guaiFENesin (MUCINEX) 600 MG 12 hr tablet Take 2 tablets (1,200 mg total) by mouth 2 (two) times daily. 05/16/17   Zigmund Gottron, NP  sodium chloride (OCEAN) 0.65 % SOLN nasal spray Place 1 spray into both nostrils as needed for congestion. 05/16/17   Zigmund Gottron, NP    Family History Family History  Problem Relation Age of Onset  . Diabetes Mother   . Cancer Mother        KIDNEY  . Breast cancer Maternal Aunt   . Liver cancer Maternal Aunt   . Leukemia Son     Social History Social History   Tobacco Use  . Smoking status: Former Research scientist (life sciences)  . Smokeless tobacco: Never Used  Substance Use Topics  . Alcohol use: Yes    Alcohol/week: 0.5 oz    Types: 1 Standard drinks or equivalent per week  . Drug use: No     Allergies   Penicillins   Review of Systems Review of Systems   Physical Exam Triage Vital Signs ED Triage  Vitals  Enc Vitals Group     BP 05/16/17 1024 129/77     Pulse Rate 05/16/17 1024 77     Resp --      Temp 05/16/17 1024 98.1 F (36.7 C)     Temp Source 05/16/17 1024 Oral     SpO2 05/16/17 1024 99 %     Weight --      Height --      Head Circumference --      Peak Flow --      Pain Score 05/16/17 1020 6     Pain Loc --      Pain Edu? --      Excl. in [place]? --    No data found.  Updated Vital Signs BP 129/77 (BP Location: Right Arm)   Pulse 77   Temp 98.1 F (36.7 C) (Oral)   SpO2 99%   Visual Acuity Right Eye Distance:   Left Eye Distance:   Bilateral Distance:    Right Eye Near:   Left Eye Near:    Bilateral Near:     Physical Exam  Constitutional: She is oriented to person, place, and time. She appears well-developed and well-nourished. No distress.  HENT:  Head: Normocephalic and atraumatic.  Right Ear: Tympanic membrane, external ear and ear canal normal.  Left Ear: Tympanic membrane, external ear and ear canal normal.  Nose: Mucosal edema and rhinorrhea present. Right sinus  exhibits no maxillary sinus tenderness and no frontal sinus tenderness. Left sinus exhibits no maxillary sinus tenderness and no frontal sinus tenderness.  Mouth/Throat: Uvula is midline, oropharynx is clear and moist and mucous membranes are normal. No tonsillar exudate.  Eyes: Pupils are equal, round, and reactive to light. Conjunctivae and EOM are normal.  Cardiovascular: Normal rate, regular rhythm and normal heart sounds.  Pulmonary/Chest: Effort normal and breath sounds normal.  Neurological: She is alert and oriented to person, place, and time.  Skin: Skin is warm and dry.     UC Treatments / Results  Labs (all labs ordered are listed, but only abnormal results are displayed) Labs Reviewed - No data to display  EKG  EKG Interpretation None       Radiology No results found.  Procedures Procedures (including critical care time)  Medications Ordered in UC Medications  acetaminophen (TYLENOL) tablet 975 mg (has no administration in time range)     Initial Impression / Assessment and Plan / UC Course  I have reviewed the triage vital signs and the nursing notes.  Pertinent labs & imaging results that were available during my care of the patient were reviewed by me and considered in my medical decision making (see chart for details).     Benign physical findings. Non toxic in appearance. History and physical consistent with viral illness.  Supportive cares recommended. If symptoms worsen or do not improve in the next week to return to be seen or to follow up with PCP.  Patient verbalized understanding and agreeable to plan.    Final Clinical Impressions(s) / UC Diagnoses   Final diagnoses:  Upper respiratory tract infection, unspecified type    ED Discharge Orders        Ordered    fluticasone (FLONASE) 50 MCG/ACT nasal spray  Daily     05/16/17 1035    sodium chloride (OCEAN) 0.65 % SOLN nasal spray  As needed     05/16/17 1035    guaiFENesin (MUCINEX) 600 MG  12 hr tablet  2 times  daily     05/16/17 1035       Controlled Substance Prescriptions [place] Controlled Substance Registry consulted? n/a   Zigmund Gottron, NP 05/16/17 1038

## 2017-05-16 NOTE — Discharge Instructions (Addendum)
Push fluids to ensure adequate hydration and keep secretions thin.  Tylenol and/or ibuprofen as needed for pain or fevers.  Daily flonase. Nasal spray throughout the day to help promote nasal drainage to relieve pressure. Mucinex as an expectorant may be help. Zyrtec daily may also be helpful.  If symptoms worsen or do not improve in the next week to return to be seen or to follow up with your PCP.

## 2017-05-16 NOTE — ED Triage Notes (Signed)
Pt reports a lot of nasal congestion with head pressure and bilateral ear discomfort x3 days.

## 2020-10-04 ENCOUNTER — Other Ambulatory Visit: Payer: Self-pay | Admitting: Internal Medicine

## 2020-10-04 DIAGNOSIS — M7989 Other specified soft tissue disorders: Secondary | ICD-10-CM

## 2020-10-05 ENCOUNTER — Ambulatory Visit
Admission: RE | Admit: 2020-10-05 | Discharge: 2020-10-05 | Disposition: A | Discharge status: Home / Self Care | Payer: BC Managed Care – PPO | Source: Ambulatory Visit | Attending: Internal Medicine | Admitting: Internal Medicine

## 2020-10-05 ENCOUNTER — Other Ambulatory Visit: Payer: Self-pay

## 2020-10-05 DIAGNOSIS — M7989 Other specified soft tissue disorders: Secondary | ICD-10-CM

## 2021-08-08 ENCOUNTER — Other Ambulatory Visit: Payer: Self-pay

## 2021-08-08 ENCOUNTER — Emergency Department (HOSPITAL_BASED_OUTPATIENT_CLINIC_OR_DEPARTMENT_OTHER)
Admission: EM | Admit: 2021-08-08 | Discharge: 2021-08-08 | Disposition: A | Discharge status: Home / Self Care | Payer: BC Managed Care – PPO | Attending: Emergency Medicine | Admitting: Emergency Medicine

## 2021-08-08 ENCOUNTER — Emergency Department (HOSPITAL_BASED_OUTPATIENT_CLINIC_OR_DEPARTMENT_OTHER): Payer: BC Managed Care – PPO

## 2021-08-08 DIAGNOSIS — R519 Headache, unspecified: Secondary | ICD-10-CM | POA: Diagnosis present

## 2021-08-08 DIAGNOSIS — Y92410 Unspecified street and highway as the place of occurrence of the external cause: Secondary | ICD-10-CM | POA: Diagnosis not present

## 2021-08-08 MED ORDER — ACETAMINOPHEN 325 MG PO TABS
650.0000 mg | ORAL_TABLET | Freq: Once | ORAL | Status: AC
  Administered 2021-08-08: 650 mg via ORAL
  Filled 2021-08-08: qty 2

## 2021-08-08 NOTE — ED Notes (Signed)
Patient transported to CT 
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
## 2021-08-08 NOTE — Discharge Instructions (Addendum)
Your CT image of your brain did not show any bleeding or new injury.  Continue taking Tylenol as needed at home.  Follow-up with your primary care doctor this week.  Return to the ER if you have fevers or worsening symptoms or any additional concerns.

## 2021-11-09 ENCOUNTER — Ambulatory Visit (HOSPITAL_COMMUNITY)
Admission: EM | Admit: 2021-11-09 | Discharge: 2021-11-09 | Disposition: A | Discharge status: Home / Self Care | Payer: BC Managed Care – PPO | Attending: Emergency Medicine | Admitting: Emergency Medicine

## 2021-11-09 ENCOUNTER — Encounter (HOSPITAL_COMMUNITY): Payer: Self-pay | Admitting: Emergency Medicine

## 2021-11-09 ENCOUNTER — Ambulatory Visit (INDEPENDENT_AMBULATORY_CARE_PROVIDER_SITE_OTHER): Payer: BC Managed Care – PPO

## 2021-11-09 DIAGNOSIS — S62327A Displaced fracture of shaft of fifth metacarpal bone, left hand, initial encounter for closed fracture: Secondary | ICD-10-CM | POA: Diagnosis not present

## 2021-11-09 MED ORDER — HYDROCODONE-ACETAMINOPHEN 5-325 MG PO TABS: 1.0000 | ORAL_TABLET | Freq: Four times a day (QID) | ORAL | 0 refills | Status: AC | PRN

## 2021-11-09 MED ORDER — IBUPROFEN 800 MG PO TABS
800.0000 mg | ORAL_TABLET | Freq: Once | ORAL | Status: AC
  Administered 2021-11-09: 800 mg via ORAL

## 2021-11-09 MED ORDER — IBUPROFEN 800 MG PO TABS
ORAL_TABLET | ORAL | Status: AC
  Filled 2021-11-09: qty 1
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
## 2021-11-09 NOTE — Progress Notes (Signed)
Orthopedic Tech Progress Note Patient Details:  Sarah Carr 21-Jul-1935 [id number]  Plaster ulnar gutter splint applied to LUE. Placed in sling for general comfort. Movement and sensation of fingers not impeded by splint.  Ortho Devices Type of Ortho Device: Ulna gutter splint, Arm sling Ortho Device/Splint Location: LUE Ortho Device/Splint Interventions: Ordered, Application, Adjustment   Post Interventions Patient Tolerated: Well Instructions Provided: Care of device, Adjustment of device  Yohana Bartha Jeri Modena 11/09/2021, 6:14 PM

## 2021-11-09 NOTE — Discharge Instructions (Addendum)
Please call the orthopedic specialists to set up an appointment for sometime next week.  You can take tylenol or ibuprofen every 4-6 hours as needed for pain. For severe pain, or pain that does not respond to ibuprofen, you can take the Norco. Please do not drive or drink alcohol while using the Norco. This medicine will make you drowsy.
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
## 2023-01-02 ENCOUNTER — Other Ambulatory Visit: Payer: Self-pay | Admitting: Internal Medicine

## 2023-01-02 ENCOUNTER — Ambulatory Visit
Admission: RE | Admit: 2023-01-02 | Discharge: 2023-01-02 | Disposition: A | Discharge status: Home / Self Care | Payer: BC Managed Care – PPO | Source: Ambulatory Visit | Attending: Internal Medicine | Admitting: Internal Medicine

## 2023-01-02 DIAGNOSIS — J129 Viral pneumonia, unspecified: Secondary | ICD-10-CM

## 2023-03-05 ENCOUNTER — Encounter (INDEPENDENT_AMBULATORY_CARE_PROVIDER_SITE_OTHER): Payer: Self-pay

## 2023-03-05 ENCOUNTER — Ambulatory Visit (INDEPENDENT_AMBULATORY_CARE_PROVIDER_SITE_OTHER): Payer: 59 | Admitting: Otolaryngology

## 2023-03-05 VITALS — BP 131/84 | HR 80 | Ht 63.0 in | Wt 174.0 lb

## 2023-03-05 DIAGNOSIS — H6123 Impacted cerumen, bilateral: Secondary | ICD-10-CM | POA: Insufficient documentation

## 2023-03-05 NOTE — Progress Notes (Signed)
 Patient ID: Sarah Carr, female   DOB: 1935/11/05, 88 y.o.   MRN: [id number]  Procedure: Bilateral cerumen disimpaction.   Indication: Cerumen impaction, resulting in ear discomfort and conductive hearing loss.   Description: The patient is placed supine on the operating table. Under the operating microscope, the right ear canal is examined and is noted to be impacted with cerumen. The cerumen is carefully removed with a combination of suction catheters, cerumen curette, and alligator forceps. After the cerumen removal, the ear canal and tympanic membrane are noted to be normal. No middle ear effusion is noted. The same procedure is then repeated on the left side without exception. The patient tolerated the procedure well.  Follow-up care:  The patient is instructed not to use Q-tips to clean the ear canals. The patient will follow up in 6 months.

## 2023-06-13 IMAGING — US US EXTREM LOW VENOUS
1 series · 13 of 24 positions shown · non-contrast
Comparison: None.

CLINICAL DATA: 84-year-old female with bilateral lower extremity
swelling for 5 days.



[Series 1: us extrem low venous · 0.08mm/px · 13 of 57 slices shown]
[im 1/57]
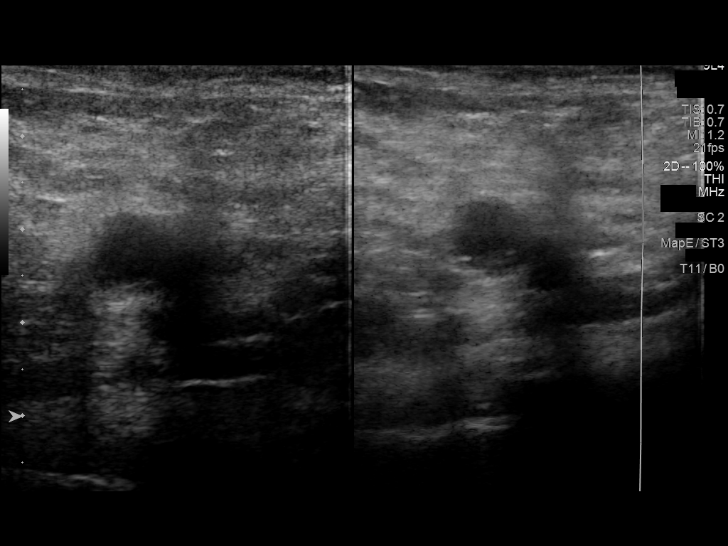
[im 5/57]
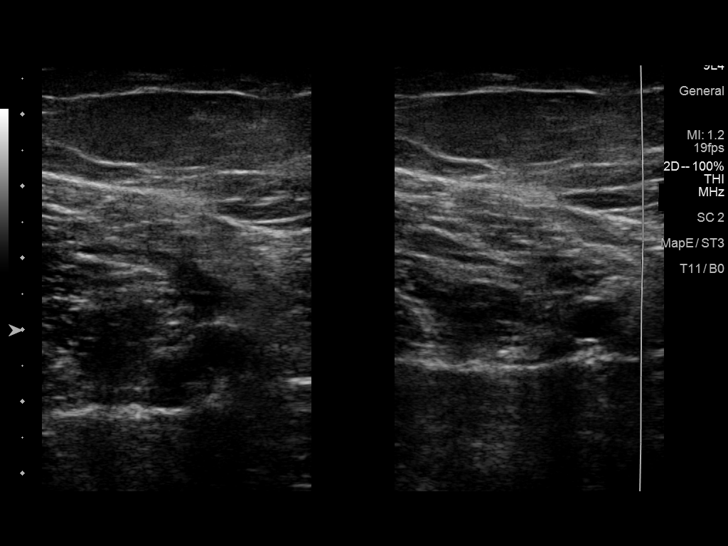
[im 10/57]
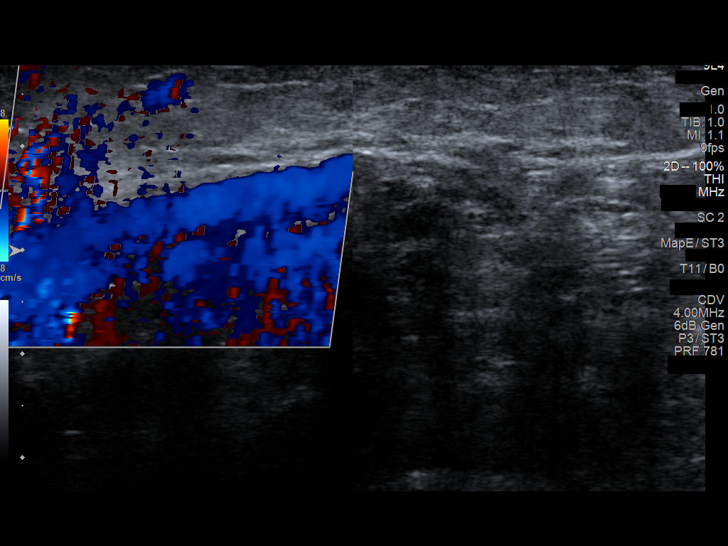
[im 15/57]
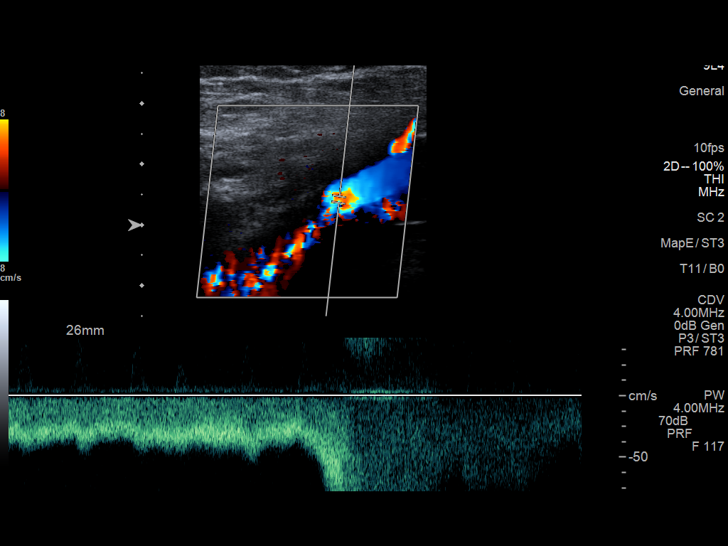
[im 20/57]
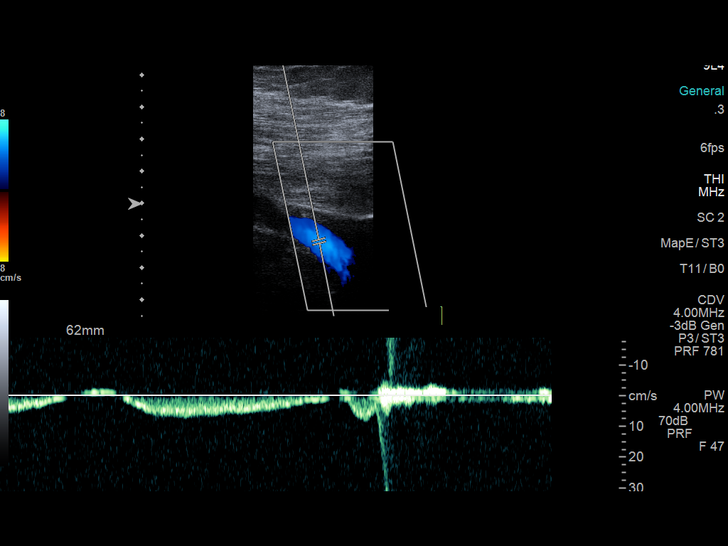
[im 25/57]
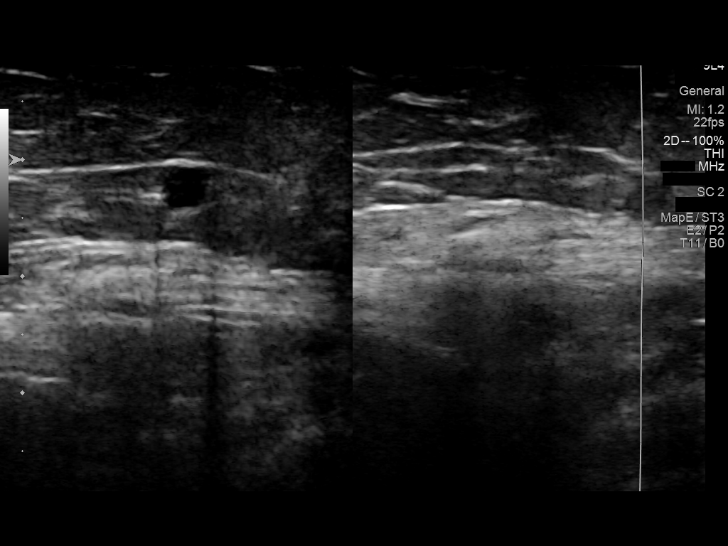
[im 30/57]
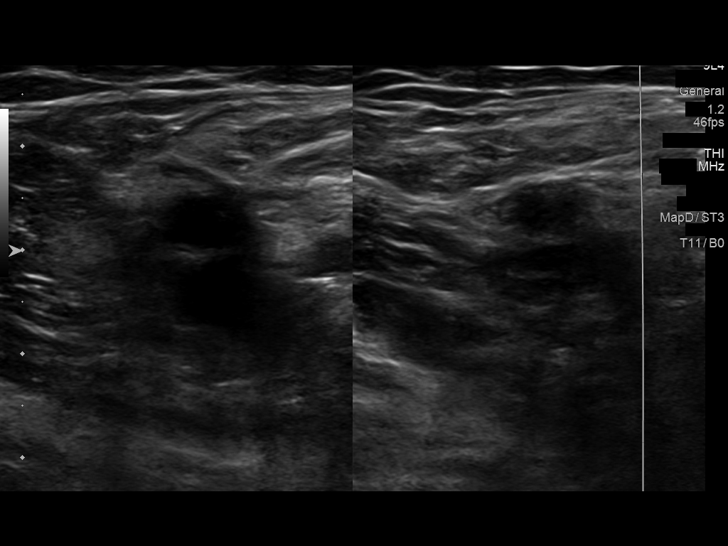
[im 32/57]
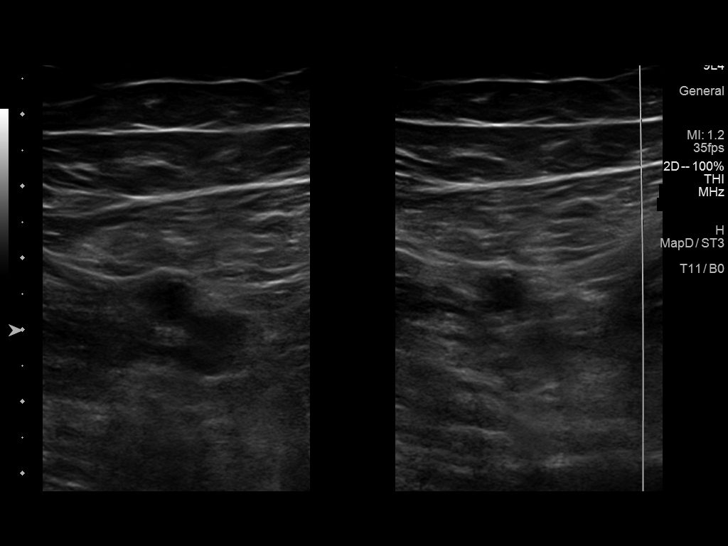
[im 37/57]
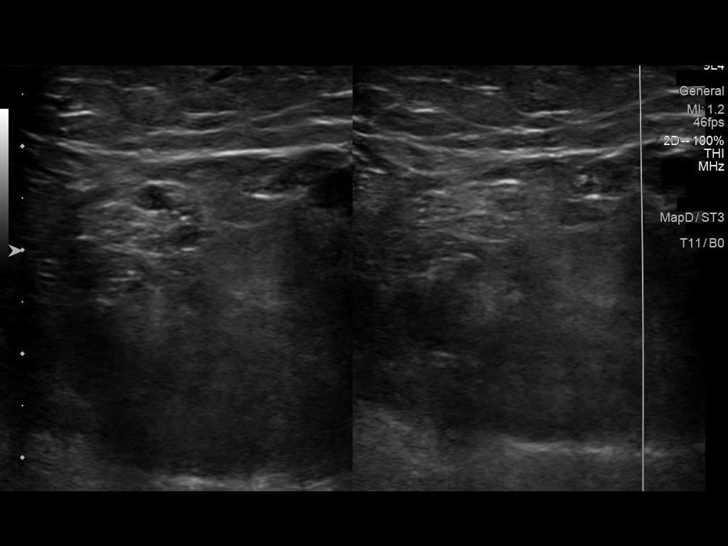
[im 42/57]
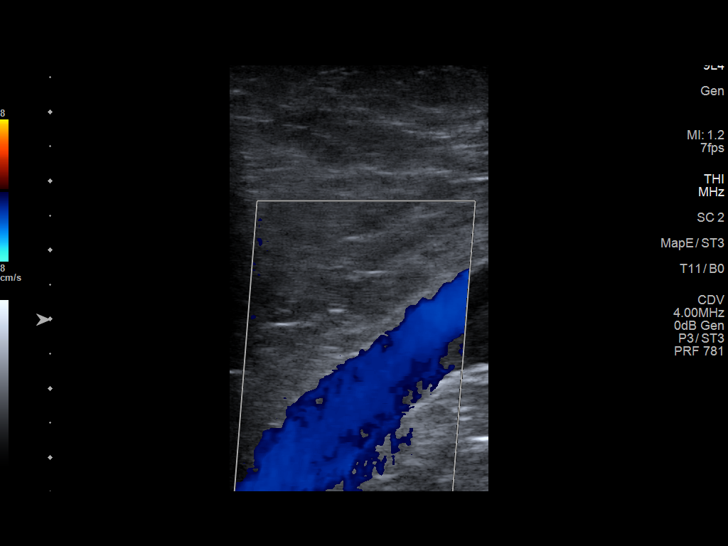
[im 47/57]
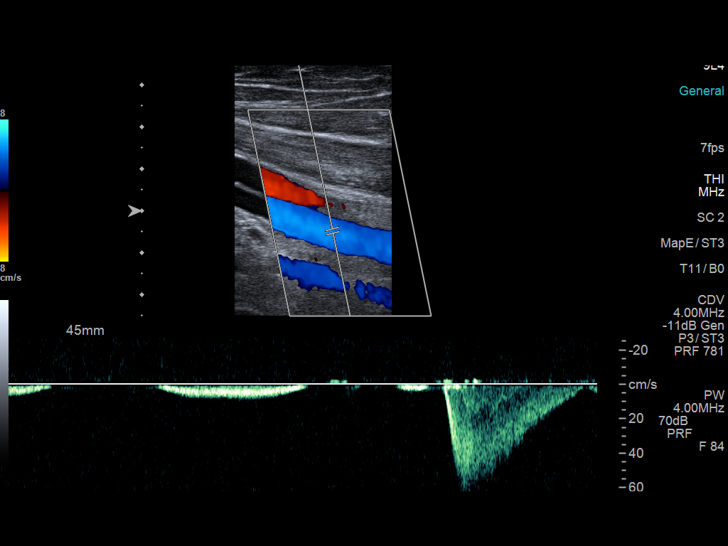
[im 52/57]
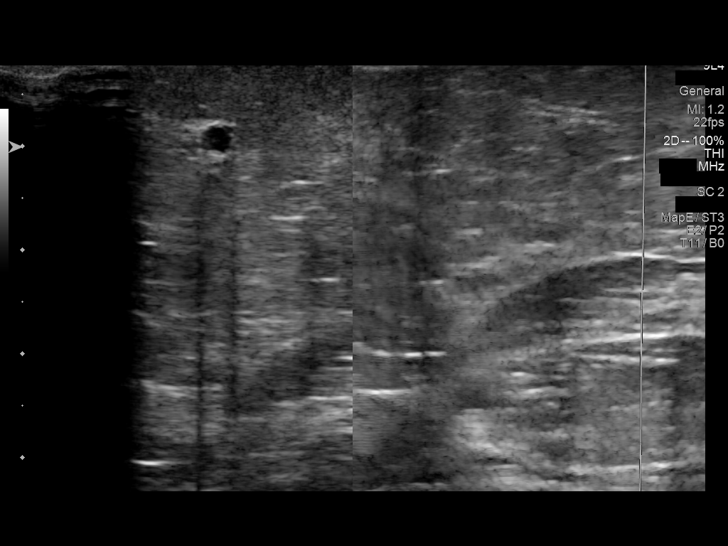
[im 57/57]
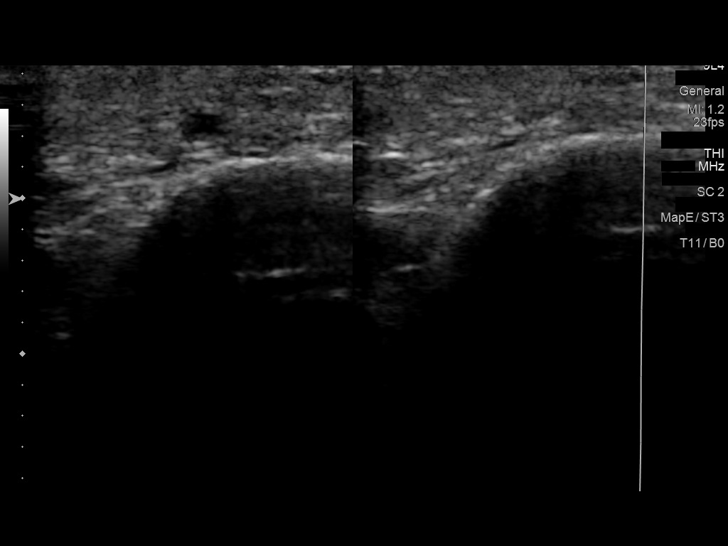

[13 of 24 positions shown; findings below may reference images not displayed]

FINDINGS: RIGHT LOWER EXTREMITY

Common Femoral Vein: No evidence of thrombus. Normal
compressibility, respiratory phasicity and response to augmentation.

Saphenofemoral Junction: No evidence of thrombus. Normal
compressibility and flow on color Doppler imaging.

Profunda Femoral Vein: No evidence of thrombus. Normal
compressibility and flow on color Doppler imaging.

Femoral Vein: No evidence of thrombus. Normal compressibility,
respiratory phasicity and response to augmentation.

Popliteal Vein: No evidence of thrombus. Normal compressibility,
respiratory phasicity and response to augmentation.

Calf Veins: No evidence of thrombus. Normal compressibility and flow
on color Doppler imaging.

Other Findings:  None.

LEFT LOWER EXTREMITY

Common Femoral Vein: No evidence of thrombus. Normal
compressibility, respiratory phasicity and response to augmentation.

Saphenofemoral Junction: No evidence of thrombus. Normal
compressibility and flow on color Doppler imaging.

Profunda Femoral Vein: No evidence of thrombus. Normal
compressibility and flow on color Doppler imaging.

Femoral Vein: No evidence of thrombus. Normal compressibility,
respiratory phasicity and response to augmentation.

Popliteal Vein: No evidence of thrombus. Normal compressibility,
respiratory phasicity and response to augmentation.

Calf Veins: No evidence of thrombus. Normal compressibility and flow
on color Doppler imaging.

Other Findings:  None.
IMPRESSION: No evidence of bilateral lower extremity deep venous thrombosis.

## 2023-09-02 ENCOUNTER — Encounter (INDEPENDENT_AMBULATORY_CARE_PROVIDER_SITE_OTHER): Payer: Self-pay | Admitting: Otolaryngology

## 2023-09-02 ENCOUNTER — Ambulatory Visit (INDEPENDENT_AMBULATORY_CARE_PROVIDER_SITE_OTHER): Payer: 59 | Admitting: Otolaryngology

## 2023-09-02 VITALS — BP 121/78 | HR 94

## 2023-09-02 DIAGNOSIS — H6123 Impacted cerumen, bilateral: Secondary | ICD-10-CM | POA: Diagnosis not present

## 2023-09-02 NOTE — Progress Notes (Signed)
 Patient ID: Sarah Carr, female   DOB: 1935/11/05, 88 y.o.   MRN: [id number]  Procedure: Bilateral cerumen disimpaction.   Indication: Cerumen impaction, resulting in ear discomfort and conductive hearing loss.   Description: The patient is placed supine on the operating table. Under the operating microscope, the right ear canal is examined and is noted to be impacted with cerumen. The cerumen is carefully removed with a combination of suction catheters, cerumen curette, and alligator forceps. After the cerumen removal, the ear canal and tympanic membrane are noted to be normal. No middle ear effusion is noted. The same procedure is then repeated on the left side without exception. The patient tolerated the procedure well.  Follow-up care:  The patient is instructed not to use Q-tips to clean the ear canals. The patient will follow up in 6 months.

## 2023-11-26 ENCOUNTER — Other Ambulatory Visit: Payer: Self-pay | Admitting: Internal Medicine

## 2023-11-26 DIAGNOSIS — R42 Dizziness and giddiness: Secondary | ICD-10-CM

## 2023-11-28 ENCOUNTER — Ambulatory Visit
Admission: RE | Admit: 2023-11-28 | Discharge: 2023-11-28 | Disposition: A | Discharge status: Home / Self Care | Source: Ambulatory Visit | Attending: Internal Medicine | Admitting: Internal Medicine

## 2023-11-28 DIAGNOSIS — R42 Dizziness and giddiness: Secondary | ICD-10-CM

## 2023-12-02 ENCOUNTER — Ambulatory Visit
Admission: RE | Admit: 2023-12-02 | Discharge: 2023-12-02 | Disposition: A | Source: Ambulatory Visit | Attending: Internal Medicine | Admitting: Internal Medicine

## 2023-12-02 DIAGNOSIS — R42 Dizziness and giddiness: Secondary | ICD-10-CM

## 2024-02-08 ENCOUNTER — Emergency Department (HOSPITAL_COMMUNITY)

## 2024-02-08 ENCOUNTER — Encounter (HOSPITAL_COMMUNITY): Payer: Self-pay | Admitting: Internal Medicine

## 2024-02-08 ENCOUNTER — Other Ambulatory Visit: Payer: Self-pay

## 2024-02-08 ENCOUNTER — Inpatient Hospital Stay (HOSPITAL_COMMUNITY)
Admission: EM | Admit: 2024-02-08 | Discharge: 2024-02-12 | Disposition: A | Source: Home / Self Care | Attending: Internal Medicine | Admitting: Internal Medicine

## 2024-02-08 DIAGNOSIS — E66811 Obesity, class 1: Secondary | ICD-10-CM | POA: Diagnosis present

## 2024-02-08 DIAGNOSIS — Z85038 Personal history of other malignant neoplasm of large intestine: Secondary | ICD-10-CM

## 2024-02-08 DIAGNOSIS — Z803 Family history of malignant neoplasm of breast: Secondary | ICD-10-CM

## 2024-02-08 DIAGNOSIS — Z8051 Family history of malignant neoplasm of kidney: Secondary | ICD-10-CM

## 2024-02-08 DIAGNOSIS — I82452 Acute embolism and thrombosis of left peroneal vein: Secondary | ICD-10-CM | POA: Diagnosis present

## 2024-02-08 DIAGNOSIS — Z1152 Encounter for screening for COVID-19: Secondary | ICD-10-CM

## 2024-02-08 DIAGNOSIS — Z9049 Acquired absence of other specified parts of digestive tract: Secondary | ICD-10-CM

## 2024-02-08 DIAGNOSIS — Z833 Family history of diabetes mellitus: Secondary | ICD-10-CM

## 2024-02-08 DIAGNOSIS — I4892 Unspecified atrial flutter: Secondary | ICD-10-CM | POA: Diagnosis present

## 2024-02-08 DIAGNOSIS — Z7901 Long term (current) use of anticoagulants: Secondary | ICD-10-CM

## 2024-02-08 DIAGNOSIS — Z23 Encounter for immunization: Secondary | ICD-10-CM

## 2024-02-08 DIAGNOSIS — E7800 Pure hypercholesterolemia, unspecified: Secondary | ICD-10-CM | POA: Diagnosis present

## 2024-02-08 DIAGNOSIS — J189 Pneumonia, unspecified organism: Secondary | ICD-10-CM

## 2024-02-08 DIAGNOSIS — Z6830 Body mass index (BMI) 30.0-30.9, adult: Secondary | ICD-10-CM

## 2024-02-08 DIAGNOSIS — I4891 Unspecified atrial fibrillation: Secondary | ICD-10-CM | POA: Insufficient documentation

## 2024-02-08 DIAGNOSIS — Z860100 Personal history of colon polyps, unspecified: Secondary | ICD-10-CM

## 2024-02-08 DIAGNOSIS — I480 Paroxysmal atrial fibrillation: Secondary | ICD-10-CM | POA: Diagnosis present

## 2024-02-08 DIAGNOSIS — Z87891 Personal history of nicotine dependence: Secondary | ICD-10-CM

## 2024-02-08 DIAGNOSIS — H401131 Primary open-angle glaucoma, bilateral, mild stage: Secondary | ICD-10-CM | POA: Diagnosis present

## 2024-02-08 DIAGNOSIS — R202 Paresthesia of skin: Secondary | ICD-10-CM | POA: Diagnosis present

## 2024-02-08 DIAGNOSIS — I2694 Multiple subsegmental pulmonary emboli without acute cor pulmonale: Principal | ICD-10-CM

## 2024-02-08 DIAGNOSIS — I5033 Acute on chronic diastolic (congestive) heart failure: Secondary | ICD-10-CM | POA: Diagnosis not present

## 2024-02-08 DIAGNOSIS — Z880 Allergy status to penicillin: Secondary | ICD-10-CM

## 2024-02-08 DIAGNOSIS — I2699 Other pulmonary embolism without acute cor pulmonale: Secondary | ICD-10-CM | POA: Diagnosis present

## 2024-02-08 DIAGNOSIS — E039 Hypothyroidism, unspecified: Secondary | ICD-10-CM | POA: Diagnosis present

## 2024-02-08 DIAGNOSIS — I82442 Acute embolism and thrombosis of left tibial vein: Secondary | ICD-10-CM | POA: Diagnosis present

## 2024-02-08 DIAGNOSIS — Z79899 Other long term (current) drug therapy: Secondary | ICD-10-CM

## 2024-02-08 DIAGNOSIS — Z806 Family history of leukemia: Secondary | ICD-10-CM

## 2024-02-08 DIAGNOSIS — Z800 Family history of malignant neoplasm of digestive organs: Secondary | ICD-10-CM

## 2024-02-08 LAB — COMPREHENSIVE METABOLIC PANEL WITH GFR
ALT: 23 U/L (ref 0–44)
AST: 22 U/L (ref 15–41)
Albumin: 3.8 g/dL (ref 3.5–5.0)
Alkaline Phosphatase: 67 U/L (ref 38–126)
Anion gap: 11 (ref 5–15)
BUN: 11 mg/dL (ref 8–23)
CO2: 26 mmol/L (ref 22–32)
Calcium: 9 mg/dL (ref 8.9–10.3)
Chloride: 102 mmol/L (ref 98–111)
Creatinine, Ser: 0.77 mg/dL (ref 0.44–1.00)
GFR, Estimated: >60 mL/min (ref >60)
Glucose, Bld: 102 mg/dL — ABNORMAL HIGH (ref 70–99)
Potassium: 3.8 mmol/L (ref 3.5–5.1)
Sodium: 138 mmol/L (ref 135–145)
Total Bilirubin: 0.7 mg/dL (ref 0.0–1.2)
Total Protein: 6.9 g/dL (ref 6.5–8.1)

## 2024-02-08 LAB — CBC WITH DIFFERENTIAL/PLATELET
Abs Immature Granulocytes: 0.03 K/uL (ref 0.00–0.07)
Basophils Absolute: 0 K/uL (ref 0.0–0.1)
Basophils Relative: 0 %
Eosinophils Absolute: 0 K/uL (ref 0.0–0.5)
Eosinophils Relative: 1 %
HCT: 37.8 % (ref 36.0–46.0)
Hemoglobin: 12.3 g/dL (ref 12.0–15.0)
Immature Granulocytes: 0 %
Lymphocytes Relative: 27 %
Lymphs Abs: 1.9 K/uL (ref 0.7–4.0)
MCH: 28.5 pg (ref 26.0–34.0)
MCHC: 32.5 g/dL (ref 30.0–36.0)
MCV: 87.5 fL (ref 80.0–100.0)
Monocytes Absolute: 0.7 K/uL (ref 0.1–1.0)
Monocytes Relative: 10 %
Neutro Abs: 4.3 K/uL (ref 1.7–7.7)
Neutrophils Relative %: 62 %
Platelets: 228 K/uL (ref 150–400)
RBC: 4.32 MIL/uL (ref 3.87–5.11)
RDW: 13.5 % (ref 11.5–15.5)
WBC: 7 K/uL (ref 4.0–10.5)
nRBC: 0 % (ref 0.0–0.2)

## 2024-02-08 LAB — MAGNESIUM: Magnesium: 2.2 mg/dL (ref 1.7–2.4)

## 2024-02-08 LAB — RESP PANEL BY RT-PCR (RSV, FLU A&B, COVID)  RVPGX2
Influenza A by PCR: NEGATIVE
Influenza B by PCR: NEGATIVE
Resp Syncytial Virus by PCR: NEGATIVE
SARS Coronavirus 2 by RT PCR: NEGATIVE

## 2024-02-08 LAB — TSH: TSH: 1.35 u[IU]/mL (ref 0.350–4.500)

## 2024-02-08 LAB — TROPONIN T, HIGH SENSITIVITY
Troponin T High Sensitivity: 20 ng/L — ABNORMAL HIGH (ref 0–19)
Troponin T High Sensitivity: 18 ng/L (ref 0–19)

## 2024-02-08 LAB — MRSA NEXT GEN BY PCR, NASAL: MRSA by PCR Next Gen: NOT DETECTED

## 2024-02-08 LAB — PRO BRAIN NATRIURETIC PEPTIDE: Pro Brain Natriuretic Peptide: 2077 pg/mL — ABNORMAL HIGH (ref <300.0)

## 2024-02-08 MED ORDER — CHLORHEXIDINE GLUCONATE CLOTH 2 % EX PADS
6.0000 | MEDICATED_PAD | Freq: Every day | CUTANEOUS | Status: DC
  Administered 2024-02-08 – 2024-02-12 (×5): 6 via TOPICAL

## 2024-02-08 MED ORDER — ORAL CARE MOUTH RINSE: 15.0000 mL | OROMUCOSAL | Status: DC | PRN

## 2024-02-08 MED ORDER — POLYETHYLENE GLYCOL 3350 17 G PO PACK: 17.0000 g | PACK | Freq: Every day | ORAL | Status: DC | PRN

## 2024-02-08 MED ORDER — ACETAMINOPHEN 325 MG PO TABS: 650.0000 mg | ORAL_TABLET | Freq: Four times a day (QID) | ORAL | Status: DC | PRN

## 2024-02-08 MED ORDER — DILTIAZEM LOAD VIA INFUSION
10.0000 mg | Freq: Once | INTRAVENOUS | Status: AC
  Administered 2024-02-08: 10 mg via INTRAVENOUS
  Filled 2024-02-08: qty 10

## 2024-02-08 MED ORDER — SODIUM CHLORIDE 0.9 % IV SOLN
1.0000 g | Freq: Once | INTRAVENOUS | Status: AC
  Administered 2024-02-08: 1 g via INTRAVENOUS
  Filled 2024-02-08: qty 10

## 2024-02-08 MED ORDER — HEPARIN (PORCINE) 25000 UT/250ML-% IV SOLN
1050.0000 [IU]/h | INTRAVENOUS | Status: DC
  Administered 2024-02-08: 1400 [IU]/h via INTRAVENOUS
  Administered 2024-02-09: 1250 [IU]/h via INTRAVENOUS
  Administered 2024-02-10 – 2024-02-11 (×2): 1050 [IU]/h via INTRAVENOUS
  Filled 2024-02-08 (×4): qty 250

## 2024-02-08 MED ORDER — HEPARIN BOLUS VIA INFUSION
4500.0000 [IU] | Freq: Once | INTRAVENOUS | Status: AC
  Administered 2024-02-08: 4500 [IU] via INTRAVENOUS
  Filled 2024-02-08: qty 4500

## 2024-02-08 MED ORDER — IOHEXOL 350 MG/ML SOLN
75.0000 mL | Freq: Once | INTRAVENOUS | Status: AC | PRN
  Administered 2024-02-08: 75 mL via INTRAVENOUS

## 2024-02-08 MED ORDER — CARMEX CLASSIC LIP BALM EX OINT
TOPICAL_OINTMENT | CUTANEOUS | Status: DC | PRN
  Filled 2024-02-08: qty 10

## 2024-02-08 MED ORDER — DILTIAZEM HCL 30 MG PO TABS: 30.0000 mg | ORAL_TABLET | Freq: Four times a day (QID) | ORAL | Status: DC

## 2024-02-08 MED ORDER — LACTATED RINGERS IV BOLUS
500.0000 mL | Freq: Once | INTRAVENOUS | Status: AC
  Administered 2024-02-08: 500 mL via INTRAVENOUS

## 2024-02-08 MED ORDER — SODIUM CHLORIDE 0.9% FLUSH
3.0000 mL | Freq: Two times a day (BID) | INTRAVENOUS | Status: DC
  Administered 2024-02-08 – 2024-02-12 (×8): 3 mL via INTRAVENOUS

## 2024-02-08 MED ORDER — PNEUMOCOCCAL 20-VAL CONJ VACC 0.5 ML IM SUSY
0.5000 mL | PREFILLED_SYRINGE | INTRAMUSCULAR | Status: AC | PRN
  Administered 2024-02-12: 13:00:00 0.5 mL via INTRAMUSCULAR
  Filled 2024-02-08: qty 0.5

## 2024-02-08 MED ORDER — ACETAMINOPHEN 650 MG RE SUPP: 650.0000 mg | Freq: Four times a day (QID) | RECTAL | Status: DC | PRN

## 2024-02-08 MED ORDER — LATANOPROST 0.005 % OP SOLN
1.0000 [drp] | Freq: Every day | OPHTHALMIC | Status: DC
  Administered 2024-02-08 – 2024-02-11 (×4): 1 [drp] via OPHTHALMIC
  Filled 2024-02-08: qty 2.5

## 2024-02-08 MED ORDER — SODIUM CHLORIDE 0.9 % IV SOLN
500.0000 mg | Freq: Once | INTRAVENOUS | Status: AC
  Administered 2024-02-08: 500 mg via INTRAVENOUS
  Filled 2024-02-08: qty 5

## 2024-02-08 MED ORDER — DILTIAZEM HCL 25 MG/5ML IV SOLN
15.0000 mg | Freq: Once | INTRAVENOUS | Status: AC
  Administered 2024-02-08: 15 mg via INTRAVENOUS
  Filled 2024-02-08: qty 5

## 2024-02-08 MED ORDER — SODIUM CHLORIDE 0.9% FLUSH: 10.0000 mL | INTRAVENOUS | Status: DC | PRN

## 2024-02-08 MED ORDER — DILTIAZEM LOAD VIA INFUSION: 15.0000 mg | Freq: Once | INTRAVENOUS | Status: DC

## 2024-02-08 MED ORDER — DILTIAZEM HCL-DEXTROSE 125-5 MG/125ML-% IV SOLN (PREMIX)
5.0000 mg/h | INTRAVENOUS | Status: DC
  Administered 2024-02-08 – 2024-02-09 (×2): 5 mg/h via INTRAVENOUS
  Administered 2024-02-09: 07:00:00 7.5 mg/h via INTRAVENOUS
  Filled 2024-02-08 (×3): qty 125

## 2024-02-08 NOTE — Progress Notes (Signed)
 PHARMACY - ANTICOAGULATION CONSULT NOTE  Pharmacy Consult for IV heparin  Indication: pulmonary embolus  Allergies[1]  Patient Measurements:    Vital Signs: Temp: 98 F (36.7 C) (12/13 1158) BP: 122/72 (12/13 1158) Pulse Rate: 155 (12/13 1158)  Labs: Recent Labs    02/08/24 1355  HGB 12.3  HCT 37.8  PLT 228  CREATININE 0.77    CrCl cannot be calculated (Unknown ideal weight.).   Medical History: Past Medical History:  Diagnosis Date   Colon polyp    Fibroid     Medications:  (Not in a hospital admission)   Assessment: Pharmacy is consulted to start heparin  drip on 88 yo female sent to ED fro physician office for right sided breast pain radiating to the back. Pt not on a blood thinner and history of PE/DVT.      Today, 02/08/2024  Hgb 12.3, plt 228  SCr 0.77 mg/dl, CrCl     Goal of Therapy:  Heparin  level 0.3-0.7 units/ml Monitor platelets by anticoagulation protocol: Yes   Plan:  Heparin  4500 unit bolus followed by heparin  1400 units/hr  Obtain HL 8 hours after start of infusion Daily CBC  Monitor for signs and symptoms of bleeding  Dolphus Roller, PharmD, BCPS 02/08/2024 4:40 PM       [1]  Allergies Allergen Reactions   Penicillins

## 2024-02-08 NOTE — ED Triage Notes (Signed)
 Patient in today reporting visit to [facility] for right sided breast pain radiating to back. Reports sinus tachy on monitor. SOB.

## 2024-02-08 NOTE — ED Provider Triage Note (Signed)
 Emergency Medicine Provider Triage Evaluation Note  Sarah Carr , a 88 y.o. female  was evaluated in triage.  Pt was sent to the emergency department by [facility] physicians, for an abnormal EKG.  Patient is also reporting shortness of breath that started on Thursday as well as some right-sided abdominal pain that radiates to the back.  EKG was reperformed here in triage, and shows atrial flutter with a 2-1 block.  Patient denies any cardiac problems otherwise.  Review of Systems  Positive: Palpitations, shortness of breath Negative:   Physical Exam  BP 122/72   Pulse (!) 155   Temp 98 F (36.7 C)   Resp 18   SpO2 98%  Gen:   Awake, no distress, speaks in full sentences Resp:  Normal effort, tachycardic MSK:   Moves extremities without difficulty    Medical Decision Making  Medically screening exam initiated at 12:10 PM.  Appropriate orders placed.  Sarah Carr was informed that the remainder of the evaluation will be completed by another provider, this initial triage assessment does not replace that evaluation, and the importance of remaining in the ED until their evaluation is complete.  EKG obtained in triage showing atrial flutter.  Basic labs, chest x-ray ordered.  Charge RN aware patient needs room.   Sarah Marry RAMAN, PA-C 02/08/24 1211

## 2024-02-08 NOTE — H&P (Addendum)
 History and Physical   Sarah Carr FMW:[id number] DOB: 1935-11-10 DOA: 02/08/2024  PCP: Patient, No Pcp Per   Patient coming from: Home  Chief Complaint: Chest pain  HPI: Sarah Carr is a 88 y.o. female with medical history significant of hyperlipidemia, colon cancer status post right Hemicolectomy, glaucoma, thyroid, paresthesias presenting with chest pain.  Patient reports 2 days of chest pain located under her right breast and radiating to her back.  Pain is worse when laying flat and improved with sitting up.  Also reports some decreased p.o. intake recently as well as some mild left lower extremity swelling about the ankle.  Denies any prolonged travel, surgeries.  Further denies fevers, chills, abdominal pain, constipation, diarrhea, nausea, vomiting.  ED Course: Vital signs in the ED notable for heart rate initially in the 150s.  Lab workup included CMP with glucose 23.  CBC within normal limits.  Troponin T20, repeat pending.  proBNP 2077.  Respiratory panel for flu COVID RSV negative.  Magnesium normal.  Chest x-ray showed right lower lobe changes consistent with effusion plus minus infection.  CTA PE study showed bilateral segmental pulmonary emboli with moderate right pleural effusion and ground glass opacities which may represent infarct versus infection.  Patient received ceftriaxone , azithromycin , dose of IV diltiazem , 500 cc IV fluid.  Started on IV heparin .  Review of Systems: As per HPI otherwise all other systems reviewed and are negative.  Past Medical History:  Diagnosis Date   Colon polyp    Fibroid     Past Surgical History:  Procedure Laterality Date   ABDOMINAL HYSTERECTOMY  1975   C-SECTION   CESAREAN SECTION  1975   COLON RESECTION  04/2003   BENIGN POLYP   EXCISION OF BONE TUMOR  1962    Social History  reports that she has quit smoking. She has never used smokeless tobacco. She reports current alcohol use of about 1.0 standard drink of  alcohol per week. She reports that she does not use drugs.  Allergies[1]  Family History  Problem Relation Age of Onset   Diabetes Mother    Cancer Mother        KIDNEY   Breast cancer Maternal Aunt    Liver cancer Maternal Aunt    Leukemia Son   Reviewed admission  Prior to Admission medications  Medication Sig Start Date End Date Taking? Authorizing Provider  Cholecalciferol (VITAMIN D) 2000 UNITS CAPS Take 1 capsule by mouth daily.   Yes [provider]  Coenzyme Q10 (COQ-10 PO) Take by mouth.   Yes [provider]  LUMIGAN 0.01 % SOLN Place 1 drop into both eyes at bedtime. 03/06/17  Yes [provider]  Misc Natural Products (TOTAL MEMORY & FOCUS FORMULA PO) Take 2 tablets by mouth in the morning.   Yes [provider]  Multiple Vitamin (MULTIVITAMIN) capsule Take 1 capsule by mouth daily.   Yes [provider]  Albuterol-Budesonide (AIRSUPRA) 90-80 MCG/ACT AERO Inhale 2 puffs into the lungs 4 (four) times daily as needed (shorteness of breath). Patient not taking: Reported on 02/08/2024    [provider]    Physical Exam: Vitals:   02/08/24 1158 02/08/24 1632  BP: 122/72   Pulse: (!) 155   Resp: 18   Temp: 98 F (36.7 C)   SpO2: 98%   Weight:  78.9 kg  Height:  5' 3 (1.6 m)    Physical Exam Constitutional:      General: She is not in  acute distress.    Appearance: Normal appearance.  HENT:     Head: Normocephalic and atraumatic.     Mouth/Throat:     Mouth: Mucous membranes are moist.     Pharynx: Oropharynx is clear.  Eyes:     Extraocular Movements: Extraocular movements intact.     Pupils: Pupils are equal, round, and reactive to light.  Cardiovascular:     Rate and Rhythm: Tachycardia present. Rhythm irregular.     Pulses: Normal pulses.     Heart sounds: Normal heart sounds.  Pulmonary:     Effort: Pulmonary effort is normal. No respiratory distress.     Breath sounds: Normal breath sounds.   Abdominal:     General: Bowel sounds are normal. There is no distension.     Palpations: Abdomen is soft.     Tenderness: There is no abdominal tenderness.  Musculoskeletal:        General: No swelling or deformity.  Skin:    General: Skin is warm and dry.  Neurological:     General: No focal deficit present.     Mental Status: Mental status is at baseline.    Labs on Admission: I have personally reviewed following labs and imaging studies  CBC: Recent Labs  Lab 02/08/24 1355  WBC 7.0  NEUTROABS 4.3  HGB 12.3  HCT 37.8  MCV 87.5  PLT 228    Basic Metabolic Panel: Recent Labs  Lab 02/08/24 1355  NA 138  K 3.8  CL 102  CO2 26  GLUCOSE 102*  BUN 11  CREATININE 0.77  CALCIUM 9.0  MG 2.2    GFR: Estimated Creatinine Clearance: 50.2 mL/min (by C-G formula based on SCr of 0.77 mg/dL).  Liver Function Tests: Recent Labs  Lab 02/08/24 1355  AST 22  ALT 23  ALKPHOS 67  BILITOT 0.7  PROT 6.9  ALBUMIN 3.8    Urine analysis:    Component Value Date/Time   COLORURINE YELLOW 04/12/2011 1430   APPEARANCEUR CLOUDY (A) 04/12/2011 1430   LABSPEC >1.030 (H) 04/12/2011 1430   PHURINE 5.5 04/12/2011 1430   GLUCOSEU NEG 04/12/2011 1430   HGBUR SMALL (A) 04/12/2011 1430   BILIRUBINUR neg 04/15/2012 1559   KETONESUR TRACE (A) 04/12/2011 1430   PROTEINUR neg 04/15/2012 1559   PROTEINUR NEG 04/12/2011 1430   UROBILINOGEN negative 04/15/2012 1559   UROBILINOGEN 0.2 04/12/2011 1430   NITRITE neg 04/15/2012 1559   NITRITE NEG 04/12/2011 1430   LEUKOCYTESUR Negative 04/15/2012 1559    Radiological Exams on Admission: CT Angio Chest PE W and/or Wo Contrast Addendum Date: 02/08/2024 ** ** ** ADDENDUM #1 ** ** ** ADDENDUM: Results were discussed with Amador Boning, MD, at 3:51 pm on 02/08/2024. ---------------------------------------------------- Electronically signed by: Greig Pique MD 02/08/2024 03:55 PM EST RP Workstation: [id number]   Result Date:  02/08/2024 ** ** ** ORIGINAL REPORT ** ** ** EXAM: CTA of the Chest with contrast for PE 02/08/2024 03:36:22 PM TECHNIQUE: CTA of the chest was performed after the administration of intravenous contrast. Multiplanar reformatted images are provided for review. MIP images are provided for review. Automated exposure control, iterative reconstruction, and/or weight based adjustment of the mA/kV was utilized to reduce the radiation dose to as low as reasonably achievable. COMPARISON: None available. CLINICAL HISTORY: Pulmonary embolism (PE) suspected, high prob. FINDINGS: PULMONARY ARTERIES: Pulmonary arteries are adequately opacified for evaluation. Segmental pulmonary emboli within all lobes bilaterally. There is no central pulmonary embolism. Main pulmonary artery is normal in size. MEDIASTINUM:  The heart is mildly enlarged. There is no pericardial effusion. Aorta is normal in size. There are atherosclerotic calcifications of the aorta. LYMPH NODES: No mediastinal, hilar or axillary lymphadenopathy. LUNGS AND PLEURA: There is a moderate sized right pleural effusion. There is atelectasis in the right lower lobe and minimally in the left lower lobe. Airspace and ground glass opacities are seen in the right middle lobe and inferior right lower lobe as well as right upper lobe. Findings may be related to pulmonary infarcts. Multifocal pneumonia is not excluded. No pneumothorax. UPPER ABDOMEN: There are rounded hypodensities in the liver which are too small to characterize, likely cysts or hemangiomas. SOFT TISSUES AND BONES: No acute bone or soft tissue abnormality. IMPRESSION: 1. Segmental pulmonary emboli within all lobes bilaterally. No central pulmonary embolism. 2. Moderate sized right pleural effusion. 3. Airspace and ground glass opacities in the right middle lobe, inferior right lower lobe, and right upper lobe, possibly related to pulmonary infarcts. Multifocal pneumonia remains a consideration. Electronically  signed by: Greig Pique MD 02/08/2024 03:49 PM EST RP Workstation: [id number]   DG Chest 2 View Result Date: 02/08/2024 EXAM: 2 VIEW(S) XRAY OF THE CHEST 02/08/2024 12:55:00 PM COMPARISON: 01/02/2023 CLINICAL HISTORY: 88 year old female with shortness of breath, reports recent right lower chest pain onset 3 days ago . FINDINGS: LUNGS AND PLEURA: Vailing and confluent new right lung base opacity obscuring the right hemidiaphragm, suspicious for combined pleural effusion and collapse or consolidation. Left lung appears stable, negative. No pulmonary edema. No pneumothorax. HEART AND MEDIASTINUM: Cardiomegaly. Ectatic and calcified aorta. BONES AND SOFT TISSUES: Thoracic degenerative changes. IMPRESSION: 1. Acute and confluent right lung base opacity, suspicious for combined pleural effusion and nonspecific airspace disease or consolidation. Electronically signed by: Helayne Hurst MD 02/08/2024 01:13 PM EST RP Workstation: [id number]    EKG: Independently reviewed.  Atrial fibrillation with RVR at 119 bpm.  Nonspecific T wave changes.  Low voltage in multiple leads.  QTc prolonged at 518.  Assessment/Plan Principal Problem:   Acute pulmonary embolism (HCC) Active Problems:   Primary open angle glaucoma (POAG) of both eyes, mild stage   Pure hypercholesterolemia   Paresthesia   New onset atrial fibrillation (HCC)   Atrial fibrillation with RVR (HCC)   Pulmonary embolism > Patient presenting with right-sided chest pain radiating to the back. > Noted to be in A-fib with RVR as below, CTA positive for multiple bilateral segmental pulmonary emboli. > Patient reporting some distal left lower extremity edema but no obvious provoking factors for DVT/PE. > Started on heparin  infusion in the ED.  Not currently requiring oxygen. - Will monitor on progressive unit overnight considering high clot burden and comorbid new onset A-fib with RVR - Continue with heparin  infusion - Echocardiogram - LLE DVT  study - Supportive care  New onset atrial fibrillation with RVR > No history of A-fib.  Noted to be in RVR with heart rate in the 150s in the ED. > Heart rate improved with administration of IV diltiazem . > Likely triggered by pulmonary embolism above - Monitoring on stepdown as above - Start p.o. diltiazem  at 30 mg every 6 hours for now. - If recurrent RVR will be able to start infusion if needed - Echocardiogram as above - Check TSH ADDENDUM > Went back into RVR in the 130s - 150s on the floor - Repeat IV Diltiazem  load and start IV Diltiazem  infusion overnight. - D/C PO Diltiazem   Glaucoma - Continue home eyedrops  History of colon cancer > Status  post right hemicolectomy - Noted  DVT prophylaxis: Heparin  Code Status:   Full Family Communication:  Updated at bedside  Disposition Plan:   Patient is from:  Home  Anticipated DC to:  Home  Anticipated DC date:  1 to 3 days  Anticipated DC barriers: None  Consults called:  None Admission status:  Observation, stepdown  Severity of Illness: The appropriate patient status for this patient is OBSERVATION. Observation status is judged to be reasonable and necessary in order to provide the required intensity of service to ensure the patient's safety. The patient's presenting symptoms, physical exam findings, and initial radiographic and laboratory data in the context of their medical condition is felt to place them at decreased risk for further clinical deterioration. Furthermore, it is anticipated that the patient will be medically stable for discharge from the hospital within 2 midnights of admission.    Marsa KATHEE Scurry MD [facility]  How to contact the [facility] Attending or Consulting provider 7A - 7P or covering provider during after hours 7P -7A, for this patient?   Check the care team in [facility] and look for a) attending/consulting [facility] provider listed and b) the [facility] team listed Log into [email or web address] and use [facility]'s universal password to access. If you do not have the password, please contact the hospital operator. Locate the [facility] provider you are looking for under [facility] and page to a number that you can be directly reached. If you still have difficulty reaching the provider, please page the [facility] (Director on Call) for the Hospitalists listed on amion for assistance.  02/08/2024, 4:43 PM       [1]  Allergies Allergen Reactions   Penicillins

## 2024-02-08 NOTE — Progress Notes (Signed)
 IV team to bedside to evaluate for second IV site. Pt with sparse, difficult vasculature. Discussed with MD Franklyn and RN Thersia: we will avoid new IV insertion at this time as current IV is adequate for vascular access needs. Consult completed.

## 2024-02-08 NOTE — ED Provider Notes (Signed)
 [place] EMERGENCY DEPARTMENT AT [facility] Provider Note   CSN: [id number] Arrival date & time: 02/08/24  1140     History {Add pertinent medical, surgical, social history, OB history to HPI:1} Chief Complaint  Patient presents with   Abnormal ECG   Palpitations    Sarah Carr is a 88 y.o. female with PMH as listed below who presents with R chest pain located under her right breast radiating to her back. Worse with lying flat, better with sitting up. Started on Thursday. No h/o similar. Not worse with deep breaths or with exertion. Has no h/o Afib. No blood thinner. No h/o DVT/PE. No recent hospitalizations/surgeries, travel. No cancer. Not eating/drinking well lately. Also reporting dry mouth/throat. No shortness of breath, sore throat, f/c. Endorses cough that is nonproductive. Does endorse very mild L leg swelling by ankle without injury.   Past Medical History:  Diagnosis Date   Colon polyp    Fibroid        Home Medications Prior to Admission medications  Medication Sig Start Date End Date Taking? Authorizing Provider  Calcium Carbonate-Vitamin D (CALCIUM + D PO) Take by mouth.    [provider]  Cholecalciferol (VITAMIN D) 2000 UNITS CAPS Take by mouth.    [provider]  Coenzyme Q10 (COQ-10 PO) Take by mouth. Patient not taking: Reported on 09/02/2023    [provider]  fluticasone  (FLONASE ) 50 MCG/ACT nasal spray Place 1 spray into both nostrils daily. Patient not taking: Reported on 09/02/2023 05/16/17   Burky, Natalie B, NP  guaiFENesin  (MUCINEX ) 600 MG 12 hr tablet Take 2 tablets (1,200 mg total) by mouth 2 (two) times daily. Patient not taking: Reported on 09/02/2023 05/16/17   Burky, Natalie B, NP  LUMIGAN 0.01 % SOLN Apply 1 drop to eye Nightly. 03/06/17   [provider]  Misc Natural Products (TOTAL MEMORY & FOCUS FORMULA PO) Take 2 tablets by mouth in the morning and at bedtime.    [provider]   Multiple Vitamin (MULTIVITAMIN) capsule Take 1 capsule by mouth daily.    [provider]  sodium chloride  (OCEAN) 0.65 % SOLN nasal spray Place 1 spray into both nostrils as needed for congestion. Patient not taking: Reported on 09/02/2023 05/16/17   Burky, Natalie B, NP      Allergies    Penicillins    Review of Systems   Review of Systems A 10 point review of systems was performed and is negative unless otherwise reported in HPI.  Physical Exam Updated Vital Signs BP 122/72   Pulse (!) 155   Temp 98 F (36.7 C)   Resp 18   SpO2 98%  Physical Exam General: Normal appearing {Desc; female/female:11659}, lying in bed.  HEENT: PERRLA, Sclera anicteric, MMM, trachea midline.  Cardiology: RRR, no murmurs/rubs/gallops. BL radial and DP pulses equal bilaterally.  Resp: Normal respiratory rate and effort. CTAB, no wheezes, rhonchi, crackles.  Abd: Soft, non-tender, non-distended. No rebound tenderness or guarding.  GU: Deferred. MSK: No peripheral edema or signs of trauma. Extremities without deformity or TTP. No cyanosis or clubbing. Skin: warm, dry. No rashes or lesions. Back: No CVA tenderness Neuro: A&Ox4, CNs II-XII grossly intact. MAEs. Sensation grossly intact.  Psych: Normal mood and affect.   ED Results / Procedures / Treatments   Labs (all labs ordered are listed, but only abnormal results are displayed) Labs Reviewed  RESP PANEL BY RT-PCR (RSV, FLU A&B, COVID)  [id number]  CBC WITH DIFFERENTIAL/PLATELET  MAGNESIUM  COMPREHENSIVE METABOLIC PANEL WITH GFR  PRO BRAIN NATRIURETIC PEPTIDE  TROPONIN T, HIGH SENSITIVITY    EKG EKG Interpretation Date/Time:  Saturday February 08 2024 12:08:02 EST Ventricular Rate:  119 PR Interval:    QRS Duration:  86 QT Interval:  368 QTC Calculation: 518 R Axis:   82  Text Interpretation: Atrial fibrillation with rapid ventricular response Borderline right axis deviation Borderline low voltage, extremity leads Prolonged QT  interval Confirmed by Franklyn Gills [phone]) on 02/08/2024 12:41:22 PM  Radiology No results found.  Procedures Procedures  {Document cardiac monitor, telemetry assessment procedure when appropriate:1}  Medications Ordered in ED Medications  diltiazem  (CARDIZEM ) 1 mg/mL load via infusion 15 mg (has no administration in time range)  lactated ringers  bolus 500 mL (has no administration in time range)    ED Course/ Medical Decision Making/ A&P                          Medical Decision Making Amount and/or Complexity of Data Reviewed Labs: ordered.    This patient presents to the ED for concern of ***, this involves an extensive number of treatment options, and is a complaint that carries with it a high risk of complications and morbidity.  I considered the following differential and admission for this acute, potentially life threatening condition.   MDM:    ACS, DVT/PE, pericarditis/myocarditis, AAS. PNA, viral illness, MSK/chest wall pain. EKG/telemetry shows afib w/ RVR. Giving diltiazem  IV bolus, consider also dehydration and will give msall fluid bolus. BP 120s/***.     Labs: I Ordered, and personally interpreted labs.  The pertinent results include:  those listed above  Imaging Studies ordered: I ordered imaging studies including *** I independently visualized and interpreted imaging. I agree with the radiologist interpretation  Additional history obtained from ***.  External records from outside source obtained and reviewed including ***  Cardiac Monitoring: The patient was maintained on a cardiac monitor.  I personally viewed and interpreted the cardiac monitored which showed an underlying rhythm of: ***  Reevaluation: After the interventions noted above, I reevaluated the patient and found that they have :{resolved/improved/worsened:23923::improved}  Social Determinants of Health: ***  Disposition:  ***  Co morbidities that complicate the patient  evaluation  Past Medical History:  Diagnosis Date   Colon polyp    Fibroid      Medicines Meds ordered this encounter  Medications   diltiazem  (CARDIZEM ) 1 mg/mL load via infusion 15 mg   lactated ringers  bolus 500 mL    I have reviewed the patients home medicines and have made adjustments as needed  Problem List / ED Course: Problem List Items Addressed This Visit   None        {Document critical care time when appropriate:1} {Document review of labs and clinical decision tools ie heart score, Chads2Vasc2 etc:1}  {Document your independent review of radiology images, and any outside records:1} {Document your discussion with family members, caretakers, and with consultants:1} {Document social determinants of health affecting pt's care:1} {Document your decision making why or why not admission, treatments were needed:1}  This note was created using dictation software, which may contain spelling or grammatical errors.

## 2024-02-09 ENCOUNTER — Observation Stay (HOSPITAL_COMMUNITY)

## 2024-02-09 DIAGNOSIS — I480 Paroxysmal atrial fibrillation: Secondary | ICD-10-CM | POA: Diagnosis present

## 2024-02-09 DIAGNOSIS — Z1152 Encounter for screening for COVID-19: Secondary | ICD-10-CM | POA: Diagnosis not present

## 2024-02-09 DIAGNOSIS — I82442 Acute embolism and thrombosis of left tibial vein: Secondary | ICD-10-CM | POA: Diagnosis present

## 2024-02-09 DIAGNOSIS — I2694 Multiple subsegmental pulmonary emboli without acute cor pulmonale: Secondary | ICD-10-CM | POA: Diagnosis present

## 2024-02-09 DIAGNOSIS — E7800 Pure hypercholesterolemia, unspecified: Secondary | ICD-10-CM | POA: Diagnosis present

## 2024-02-09 DIAGNOSIS — Z87891 Personal history of nicotine dependence: Secondary | ICD-10-CM | POA: Diagnosis not present

## 2024-02-09 DIAGNOSIS — Z85038 Personal history of other malignant neoplasm of large intestine: Secondary | ICD-10-CM | POA: Diagnosis not present

## 2024-02-09 DIAGNOSIS — Z23 Encounter for immunization: Secondary | ICD-10-CM | POA: Diagnosis not present

## 2024-02-09 DIAGNOSIS — Z880 Allergy status to penicillin: Secondary | ICD-10-CM | POA: Diagnosis not present

## 2024-02-09 DIAGNOSIS — Z800 Family history of malignant neoplasm of digestive organs: Secondary | ICD-10-CM | POA: Diagnosis not present

## 2024-02-09 DIAGNOSIS — Z9049 Acquired absence of other specified parts of digestive tract: Secondary | ICD-10-CM | POA: Diagnosis not present

## 2024-02-09 DIAGNOSIS — E66811 Obesity, class 1: Secondary | ICD-10-CM | POA: Diagnosis present

## 2024-02-09 DIAGNOSIS — J90 Pleural effusion, not elsewhere classified: Secondary | ICD-10-CM

## 2024-02-09 DIAGNOSIS — I4892 Unspecified atrial flutter: Secondary | ICD-10-CM | POA: Diagnosis present

## 2024-02-09 DIAGNOSIS — Z7901 Long term (current) use of anticoagulants: Secondary | ICD-10-CM | POA: Diagnosis not present

## 2024-02-09 DIAGNOSIS — I82452 Acute embolism and thrombosis of left peroneal vein: Secondary | ICD-10-CM | POA: Diagnosis present

## 2024-02-09 DIAGNOSIS — Z8051 Family history of malignant neoplasm of kidney: Secondary | ICD-10-CM | POA: Diagnosis not present

## 2024-02-09 DIAGNOSIS — Z803 Family history of malignant neoplasm of breast: Secondary | ICD-10-CM | POA: Diagnosis not present

## 2024-02-09 DIAGNOSIS — Z833 Family history of diabetes mellitus: Secondary | ICD-10-CM | POA: Diagnosis not present

## 2024-02-09 DIAGNOSIS — I2699 Other pulmonary embolism without acute cor pulmonale: Secondary | ICD-10-CM | POA: Diagnosis not present

## 2024-02-09 DIAGNOSIS — M7989 Other specified soft tissue disorders: Secondary | ICD-10-CM | POA: Diagnosis not present

## 2024-02-09 DIAGNOSIS — R202 Paresthesia of skin: Secondary | ICD-10-CM | POA: Diagnosis present

## 2024-02-09 DIAGNOSIS — R079 Chest pain, unspecified: Secondary | ICD-10-CM | POA: Diagnosis present

## 2024-02-09 DIAGNOSIS — E039 Hypothyroidism, unspecified: Secondary | ICD-10-CM | POA: Diagnosis present

## 2024-02-09 DIAGNOSIS — I5033 Acute on chronic diastolic (congestive) heart failure: Secondary | ICD-10-CM | POA: Diagnosis not present

## 2024-02-09 DIAGNOSIS — Z806 Family history of leukemia: Secondary | ICD-10-CM | POA: Diagnosis not present

## 2024-02-09 DIAGNOSIS — I4891 Unspecified atrial fibrillation: Secondary | ICD-10-CM | POA: Diagnosis not present

## 2024-02-09 DIAGNOSIS — Z79899 Other long term (current) drug therapy: Secondary | ICD-10-CM | POA: Diagnosis not present

## 2024-02-09 DIAGNOSIS — H401131 Primary open-angle glaucoma, bilateral, mild stage: Secondary | ICD-10-CM | POA: Diagnosis present

## 2024-02-09 LAB — ECHOCARDIOGRAM COMPLETE
AR max vel: 1.41 cm2
AV Area VTI: 1.52 cm2
AV Area mean vel: 1.41 cm2
AV Mean grad: 4.5 mmHg
AV Peak grad: 8.8 mmHg
Ao pk vel: 1.49 m/s
Area-P 1/2: 4.76 cm2
Height: 63 in
S' Lateral: 2.3 cm
Weight: 2846.58 [oz_av]

## 2024-02-09 LAB — COMPREHENSIVE METABOLIC PANEL WITH GFR
ALT: 18 U/L (ref 0–44)
AST: 19 U/L (ref 15–41)
Albumin: 3.3 g/dL — ABNORMAL LOW (ref 3.5–5.0)
Alkaline Phosphatase: 60 U/L (ref 38–126)
Anion gap: 10 (ref 5–15)
BUN: 10 mg/dL (ref 8–23)
CO2: 23 mmol/L (ref 22–32)
Calcium: 8.3 mg/dL — ABNORMAL LOW (ref 8.9–10.3)
Chloride: 102 mmol/L (ref 98–111)
Creatinine, Ser: 0.7 mg/dL (ref 0.44–1.00)
GFR, Estimated: >60 mL/min (ref >60)
Glucose, Bld: 108 mg/dL — ABNORMAL HIGH (ref 70–99)
Potassium: 3.6 mmol/L (ref 3.5–5.1)
Sodium: 135 mmol/L (ref 135–145)
Total Bilirubin: 0.4 mg/dL (ref 0.0–1.2)
Total Protein: 5.9 g/dL — ABNORMAL LOW (ref 6.5–8.1)

## 2024-02-09 LAB — CBC
HCT: 31.9 % — ABNORMAL LOW (ref 36.0–46.0)
Hemoglobin: 10.5 g/dL — ABNORMAL LOW (ref 12.0–15.0)
MCH: 28.8 pg (ref 26.0–34.0)
MCHC: 32.9 g/dL (ref 30.0–36.0)
MCV: 87.6 fL (ref 80.0–100.0)
Platelets: 228 K/uL (ref 150–400)
RBC: 3.64 MIL/uL — ABNORMAL LOW (ref 3.87–5.11)
RDW: 13.7 % (ref 11.5–15.5)
WBC: 6.5 K/uL (ref 4.0–10.5)
nRBC: 0 % (ref 0.0–0.2)

## 2024-02-09 LAB — HEPARIN LEVEL (UNFRACTIONATED)
Heparin Unfractionated: 0.92 [IU]/mL — ABNORMAL HIGH (ref 0.30–0.70)
Heparin Unfractionated: >1.1 [IU]/mL — ABNORMAL HIGH (ref 0.30–0.70)
Heparin Unfractionated: 0.92 [IU]/mL — ABNORMAL HIGH (ref 0.30–0.70)

## 2024-02-09 MED ORDER — FUROSEMIDE 10 MG/ML IJ SOLN
40.0000 mg | Freq: Every day | INTRAMUSCULAR | Status: DC
  Administered 2024-02-09 – 2024-02-12 (×4): 40 mg via INTRAVENOUS
  Filled 2024-02-09 (×4): qty 4

## 2024-02-09 NOTE — Plan of Care (Signed)

## 2024-02-09 NOTE — Plan of Care (Signed)
   Problem: Clinical Measurements: Goal: Ability to maintain clinical measurements within normal limits will improve Outcome: Progressing Goal: Will remain free from infection Outcome: Progressing Goal: Diagnostic test results will improve Outcome: Progressing Goal: Respiratory complications will improve Outcome: Progressing   Problem: Activity: Goal: Risk for activity intolerance will decrease Outcome: Progressing   Problem: Nutrition: Goal: Adequate nutrition will be maintained Outcome: Progressing

## 2024-02-09 NOTE — Plan of Care (Signed)

## 2024-02-09 NOTE — Progress Notes (Signed)
 PHARMACY - ANTICOAGULATION CONSULT NOTE  Pharmacy Consult for IV heparin  Indication: pulmonary embolus  Allergies[1]  Patient Measurements: Height: 5' 3 (160 cm) Weight: 80.7 kg (177 lb 14.6 oz) IBW/kg (Calculated) : 52.4 HEPARIN  DW (KG): 70.1  Vital Signs: Temp: 98.9 F (37.2 C) (12/14 1218) Temp Source: Oral (12/14 1218) BP: 109/42 (12/14 1400) Pulse Rate: 112 (12/14 1500)  Labs: Recent Labs    02/08/24 1355 02/09/24 0214 02/09/24 1425 02/09/24 1525  HGB 12.3 10.5*  --   --   HCT 37.8 31.9*  --   --   PLT 228 228  --   --   HEPARINUNFRC  --  0.92* >1.10* 0.92*  CREATININE 0.77 0.70  --   --     Estimated Creatinine Clearance: 50.8 mL/min (by C-G formula based on SCr of 0.7 mg/dL).   Medical History: Past Medical History:  Diagnosis Date   Colon polyp    Fibroid     Medications:  Medications Prior to Admission  Medication Sig Dispense Refill Last Dose/Taking   Cholecalciferol (VITAMIN D) 2000 UNITS CAPS Take 1 capsule by mouth daily.   02/07/2024   Coenzyme Q10 (COQ-10 PO) Take by mouth.   02/07/2024   LUMIGAN 0.01 % SOLN Place 1 drop into both eyes at bedtime.  6 02/07/2024   Misc Natural Products (TOTAL MEMORY & FOCUS FORMULA PO) Take 2 tablets by mouth in the morning.   02/07/2024   Multiple Vitamin (MULTIVITAMIN) capsule Take 1 capsule by mouth daily.   Unknown   Albuterol-Budesonide (AIRSUPRA) 90-80 MCG/ACT AERO Inhale 2 puffs into the lungs 4 (four) times daily as needed (shorteness of breath). (Patient not taking: Reported on 02/08/2024)   Not Taking    Assessment: Pharmacy is consulted to start heparin  drip on 88 yo female sent to ED fro physician office for right sided breast pain radiating to the back. Pt not on a blood thinner and history of PE/DVT.      Today, 02/09/2024  Heparin  level 0.92 supra-therapeutic Per RN no bleeding or line issues with the heparin  drip  Hgb 10.5, plt 228    Goal of Therapy:  Heparin  level 0.3-0.7  units/ml Monitor platelets by anticoagulation protocol: Yes   Plan:  Decrease heparin  drip to 1050 units/hr Heparin  level in 8 hours after rate change  Daily CBC  Monitor for signs and symptoms of bleeding  Leeroy Mace RPh 02/09/2024, 4:48 PM        [1]  Allergies Allergen Reactions   Penicillins

## 2024-02-09 NOTE — Progress Notes (Signed)
 PROGRESS NOTE    Sarah Carr  FMW:[id number] DOB: [birth year] DOA: 02/08/2024 PCP: Patient, No Pcp Per   Brief Narrative:  88 y.o. female with medical history significant of hyperlipidemia, colon cancer status post right hemicolectomy, glaucoma presented with chest pain.  On presentation, she was found to have A-fib with RVR requiring Cardizem  drip.  proBNP 2077, troponin of 20.  RSV/COVID/influenza PCR negative.  Chest x-ray showed right sided pleural effusion.  CTA PE showed bilateral segmental pulmonary emboli with moderate right pleural effusion and ground glass opacities representing infarct versus infection.  She was started on heparin  drip as well.  Assessment & Plan:   Pulmonary embolism - Imaging as above.  Continue heparin  drip.  Follow 2D echo and lower extremity duplex.  Will switch to oral DOAC in 24 to 48 hours if no need for any intervention  New onset paroxysmal A-fib with RVR - Currently on Cardizem  drip.  Consult cardiology.  Continue heparin  drip  Moderate right pleural effusion - Questionable cause.  Respiratory status currently stable.  If respiratory status does not improve, might need thoracentesis  Glaucoma -Continue home eyedrops   Obesity class I - Outpatient follow-up   DVT prophylaxis: Heparin  drip Code Status: Full Family Communication: None at bedside Disposition Plan: Status is: Observation The patient will require care spanning > 2 midnights and should be moved to inpatient because: Of severity of illness    Consultants: Consult cardiology  Procedures: None  Antimicrobials: None   Subjective: Patient seen and examined at bedside.  Continues to have intermittent chest pain and shortness of breath.  No fever or vomiting reported.  Objective: Vitals:   02/09/24 0530 02/09/24 0600 02/09/24 0630 02/09/24 0824  BP: (!) 98/59 112/62 (!) 107/59   Pulse: 95 81 84   Resp: (!) 29 (!) 23 (!) 24   Temp:    98.1 F (36.7 C)  TempSrc:    Oral   SpO2: 96% 97% 96%   Weight:      Height:        Intake/Output Summary (Last 24 hours) at 02/09/2024 0827 Last data filed at 02/09/2024 0340 Gross per 24 hour  Intake 1107.89 ml  Output --  Net 1107.89 ml   Filed Weights   02/08/24 1632 02/08/24 1801  Weight: 78.9 kg 80.7 kg    Examination:  General exam: Appears calm and comfortable.  Elderly female lying in bed. Respiratory system: Bilateral decreased breath sounds at bases with scattered crackles and tachypnea Cardiovascular system: S1 & S2 heard, Rate controlled Gastrointestinal system: Abdomen is nondistended, soft and nontender. Normal bowel sounds heard. Extremities: No cyanosis, clubbing, edema  Central nervous system: Alert and oriented.  Slow to respond.  Poor historian.  No focal neurological deficits. Moving extremities Skin: No rashes, lesions or ulcers Psychiatry:  flat affect.  Not agitated.  Data Reviewed: I have personally reviewed following labs and imaging studies  CBC: Recent Labs  Lab 02/08/24 1355 02/09/24 0214  WBC 7.0 6.5  NEUTROABS 4.3  --   HGB 12.3 10.5*  HCT 37.8 31.9*  MCV 87.5 87.6  PLT 228 228   Basic Metabolic Panel: Recent Labs  Lab 02/08/24 1355 02/09/24 0214  NA 138 135  K 3.8 3.6  CL 102 102  CO2 26 23  GLUCOSE 102* 108*  BUN 11 10  CREATININE 0.77 0.70  CALCIUM 9.0 8.3*  MG 2.2  --    GFR: Estimated Creatinine Clearance: 50.8 mL/min (by C-G formula based on SCr of  0.7 mg/dL). Liver Function Tests: Recent Labs  Lab 02/08/24 1355 02/09/24 0214  AST 22 19  ALT 23 18  ALKPHOS 67 60  BILITOT 0.7 0.4  PROT 6.9 5.9*  ALBUMIN 3.8 3.3*   No results for input(s): LIPASE, AMYLASE in the last 168 hours. No results for input(s): AMMONIA in the last 168 hours. Coagulation Profile: No results for input(s): INR, PROTIME in the last 168 hours. Cardiac Enzymes: No results for input(s): CKTOTAL, CKMB, CKMBINDEX, TROPONINI in the last 168 hours. BNP (last  3 results) Recent Labs    02/08/24 1355  PROBNP 2,077.0*   HbA1C: No results for input(s): HGBA1C in the last 72 hours. CBG: No results for input(s): GLUCAP in the last 168 hours. Lipid Profile: No results for input(s): CHOL, HDL, LDLCALC, TRIG, CHOLHDL, LDLDIRECT in the last 72 hours. Thyroid Function Tests: Recent Labs    02/08/24 1908  TSH 1.350   Anemia Panel: No results for input(s): VITAMINB12, FOLATE, FERRITIN, TIBC, IRON, RETICCTPCT in the last 72 hours. Sepsis Labs: No results for input(s): PROCALCITON, LATICACIDVEN in the last 168 hours.  Recent Results (from the past 240 hours)  Resp panel by RT-PCR (RSV, Flu A&B, Covid) Anterior Nasal Swab     Status: None   Collection Time: 02/08/24  3:27 PM   Specimen: Anterior Nasal Swab  Result Value Ref Range Status   SARS Coronavirus 2 by RT PCR NEGATIVE NEGATIVE Final    Comment: (NOTE) SARS-CoV-2 target nucleic acids are NOT DETECTED.  The SARS-CoV-2 RNA is generally detectable in upper respiratory specimens during the acute phase of infection. The lowest concentration of SARS-CoV-2 viral copies this assay can detect is 138 copies/mL. A negative result does not preclude SARS-Cov-2 infection and should not be used as the sole basis for treatment or other patient management decisions. A negative result may occur with  improper specimen collection/handling, submission of specimen other than nasopharyngeal swab, presence of viral mutation(s) within the areas targeted by this assay, and inadequate number of viral copies(<138 copies/mL). A negative result must be combined with clinical observations, patient history, and epidemiological information. The expected result is Negative.  Fact Sheet for Patients:  bloggercourse.com  Fact Sheet for Healthcare Providers:  seriousbroker.it  This test is no t yet approved or cleared by the [place]  [place] FDA and  has been authorized for detection and/or diagnosis of SARS-CoV-2 by FDA under an Emergency Use Authorization (EUA). This EUA will remain  in effect (meaning this test can be used) for the duration of the COVID-19 declaration under Section 564(b)(1) of the Act, 21 U.S.C.section 360bbb-3(b)(1), unless the authorization is terminated  or revoked sooner.       Influenza A by PCR NEGATIVE NEGATIVE Final   Influenza B by PCR NEGATIVE NEGATIVE Final    Comment: (NOTE) The Xpert Xpress SARS-CoV-2/FLU/RSV plus assay is intended as an aid in the diagnosis of influenza from Nasopharyngeal swab specimens and should not be used as a sole basis for treatment. Nasal washings and aspirates are unacceptable for Xpert Xpress SARS-CoV-2/FLU/RSV testing.  Fact Sheet for Patients: bloggercourse.com  Fact Sheet for Healthcare Providers: seriousbroker.it  This test is not yet approved or cleared by the [place]  FDA and has been authorized for detection and/or diagnosis of SARS-CoV-2 by FDA under an Emergency Use Authorization (EUA). This EUA will remain in effect (meaning this test can be used) for the duration of the COVID-19 declaration under Section 564(b)(1) of the Act, 21 U.S.C. section 360bbb-3(b)(1), unless the  authorization is terminated or revoked.     Resp Syncytial Virus by PCR NEGATIVE NEGATIVE Final    Comment: (NOTE) Fact Sheet for Patients: bloggercourse.com  Fact Sheet for Healthcare Providers: seriousbroker.it  This test is not yet approved or cleared by the [place]  FDA and has been authorized for detection and/or diagnosis of SARS-CoV-2 by FDA under an Emergency Use Authorization (EUA). This EUA will remain in effect (meaning this test can be used) for the duration of the COVID-19 declaration under Section 564(b)(1) of the Act, 21 U.S.C. section  360bbb-3(b)(1), unless the authorization is terminated or revoked.  Performed at [facility], [address] [place], [place] [address]   MRSA Next Gen by PCR, Nasal     Status: None   Collection Time: 02/08/24  5:52 PM   Specimen: Nasal Mucosa; Nasal Swab  Result Value Ref Range Status   MRSA by PCR Next Gen NOT DETECTED NOT DETECTED Final    Comment: (NOTE) The GeneXpert MRSA Assay (FDA approved for NASAL specimens only), is one component of a comprehensive MRSA colonization surveillance program. It is not intended to diagnose MRSA infection nor to guide or monitor treatment for MRSA infections. Test performance is not FDA approved in patients less than 23 years old. Performed at [facility], [address] [place], [place] [address]        Scheduled Meds:  Chlorhexidine  Gluconate Cloth  6 each Topical Daily   latanoprost   1 drop Both Eyes QHS   sodium chloride  flush  3 mL Intravenous Q12H   Continuous Infusions:  diltiazem  (CARDIZEM ) infusion 7.5 mg/hr (02/09/24 0725)   heparin  1,250 Units/hr (02/09/24 0726)          Sophie Mao, MD [facility] 02/09/2024, 8:27 AM

## 2024-02-09 NOTE — Progress Notes (Signed)
 PHARMACY - ANTICOAGULATION CONSULT NOTE  Pharmacy Consult for IV heparin  Indication: pulmonary embolus  Allergies[1]  Patient Measurements: Height: 5' 3 (160 cm) Weight: 80.7 kg (177 lb 14.6 oz) IBW/kg (Calculated) : 52.4 HEPARIN  DW (KG): 70.1  Vital Signs: Temp: 99.9 F (37.7 C) (12/13 2330) Temp Source: Oral (12/13 2330) BP: 113/83 (12/14 0300) Pulse Rate: 50 (12/14 0300)  Labs: Recent Labs    02/08/24 1355 02/09/24 0214  HGB 12.3  --   HCT 37.8  --   PLT 228  --   HEPARINUNFRC  --  0.92*  CREATININE 0.77 0.70    Estimated Creatinine Clearance: 50.8 mL/min (by C-G formula based on SCr of 0.7 mg/dL).   Medical History: Past Medical History:  Diagnosis Date   Colon polyp    Fibroid     Medications:  Medications Prior to Admission  Medication Sig Dispense Refill Last Dose/Taking   Cholecalciferol (VITAMIN D) 2000 UNITS CAPS Take 1 capsule by mouth daily.   02/07/2024   Coenzyme Q10 (COQ-10 PO) Take by mouth.   02/07/2024   LUMIGAN 0.01 % SOLN Place 1 drop into both eyes at bedtime.  6 02/07/2024   Misc Natural Products (TOTAL MEMORY & FOCUS FORMULA PO) Take 2 tablets by mouth in the morning.   02/07/2024   Multiple Vitamin (MULTIVITAMIN) capsule Take 1 capsule by mouth daily.   Unknown   Albuterol-Budesonide (AIRSUPRA) 90-80 MCG/ACT AERO Inhale 2 puffs into the lungs 4 (four) times daily as needed (shorteness of breath). (Patient not taking: Reported on 02/08/2024)   Not Taking    Assessment: Pharmacy is consulted to start heparin  drip on 88 yo female sent to ED fro physician office for right sided breast pain radiating to the back. Pt not on a blood thinner and history of PE/DVT.      Today, 02/09/2024  Heparin  level 0.92 supra-therapeutic Per RN no bleeding CBC in process   Goal of Therapy:  Heparin  level 0.3-0.7 units/ml Monitor platelets by anticoagulation protocol: Yes   Plan:  Decrease heparin  drip to 1250 units/hr Heparin  level in 8  hours Daily CBC  Monitor for signs and symptoms of bleeding  Leeroy Mace RPh 02/09/2024, 3:34 AM       [1]  Allergies Allergen Reactions   Penicillins

## 2024-02-09 NOTE — Consult Note (Signed)
 CONSULTATION NOTE   Patient Name: Sarah Carr Date of Encounter: 02/09/2024 Cardiologist: None Electrophysiologist: None Advanced Heart Failure: None   Chief Complaint   Chest pain  Patient Profile   88 yo female with history of colon CA s/p hemicolectomy, hypothyroidism, hyperlipidemia and paresthesia, presents with chest pain, found to be in afib with RVR - CT also demonstrated PE and moderate right pleural effusion.  HPI   Sarah Carr is a 88 y.o. female who is being seen today for the evaluation of afib with RVR at the request of Dr. Cheryle. This is an 88 year old female with a history of colon cancer status post hemicolectomy, hypothyroidism, hyperlipidemia and paresthesias who presented with 2 days of chest pain and shortness of breath and was found initially to be in A-fib with RVR.  She underwent CT angiography which showed multiple bilateral subsegmental pulmonary emboli.  In addition there was a moderate-sized right pleural effusion.  Respiratory viral testing was negative for influenza and COVID-19.  Multifocal pneumonia could not be ruled out.  She was started on IV diltiazem .  Troponin essentially negative at 20 and 18.  Echocardiogram is pending. proBNP elevated at 2077.  She reports her chest pain has resolved and her breathing has improved today.   PMHx   Past Medical History:  Diagnosis Date   Colon polyp    Fibroid     Past Surgical History:  Procedure Laterality Date   ABDOMINAL HYSTERECTOMY  1975   C-SECTION   CESAREAN SECTION  1975   COLON RESECTION  04/2003   BENIGN POLYP   EXCISION OF BONE TUMOR  1962    FAMHx   Family History  Problem Relation Age of Onset   Diabetes Mother    Cancer Mother        KIDNEY   Breast cancer Maternal Aunt    Liver cancer Maternal Aunt    Leukemia Son     SOCHx    reports that she has quit smoking. She has never used smokeless tobacco. She reports current alcohol use of about 1.0 standard drink of  alcohol per week. She reports that she does not use drugs.  Outpatient Medications   Medications Ordered Prior to Encounter[1]  Inpatient Medications    Scheduled Meds:  Chlorhexidine  Gluconate Cloth  6 each Topical Daily   latanoprost   1 drop Both Eyes QHS   sodium chloride  flush  3 mL Intravenous Q12H    Continuous Infusions:  diltiazem  (CARDIZEM ) infusion 7.5 mg/hr (02/09/24 0725)   heparin  1,250 Units/hr (02/09/24 0726)    PRN Meds: acetaminophen  **OR** acetaminophen , lip balm, mouth rinse, pneumococcal 20-valent conjugate vaccine, polyethylene glycol, sodium chloride  flush   ALLERGIES   Allergies[2]  ROS   Pertinent items noted in HPI and remainder of comprehensive ROS otherwise negative.  Vitals   Vitals:   02/09/24 0630 02/09/24 0800 02/09/24 0820 02/09/24 0824  BP: (!) 107/59 107/73    Pulse: 84 100 87   Resp: (!) 24 19 18    Temp:    98.1 F (36.7 C)  TempSrc:    Oral  SpO2: 96% 96% 98%   Weight:      Height:        Intake/Output Summary (Last 24 hours) at 02/09/2024 0856 Last data filed at 02/09/2024 0340 Gross per 24 hour  Intake 1107.89 ml  Output --  Net 1107.89 ml   Filed Weights   02/08/24 1632 02/08/24 1801  Weight: 78.9 kg 80.7 kg  Physical Exam   General appearance: alert, no distress, and sitting upright on [place] oxygen Lungs: diminished breath sounds RLL and RML and dullness to percussion RLL and RML Heart: irregularly irregular rhythm Extremities: edema 1+ RLE Neurologic: Grossly normal  Labs   Results for orders placed or performed during the hospital encounter of 02/08/24 (from the past 48 hours)  CBC with Differential     Status: None   Collection Time: 02/08/24  1:55 PM  Result Value Ref Range   WBC 7.0 4.0 - 10.5 K/uL   RBC 4.32 3.87 - 5.11 MIL/uL   Hemoglobin 12.3 12.0 - 15.0 g/dL   HCT 62.1 63.9 - 53.9 %   MCV 87.5 80.0 - 100.0 fL   MCH 28.5 26.0 - 34.0 pg   MCHC 32.5 30.0 - 36.0 g/dL   RDW 86.4 88.4 - 84.4 %    Platelets 228 150 - 400 K/uL   nRBC 0.0 0.0 - 0.2 %   Neutrophils Relative % 62 %   Neutro Abs 4.3 1.7 - 7.7 K/uL   Lymphocytes Relative 27 %   Lymphs Abs 1.9 0.7 - 4.0 K/uL   Monocytes Relative 10 %   Monocytes Absolute 0.7 0.1 - 1.0 K/uL   Eosinophils Relative 1 %   Eosinophils Absolute 0.0 0.0 - 0.5 K/uL   Basophils Relative 0 %   Basophils Absolute 0.0 0.0 - 0.1 K/uL   Immature Granulocytes 0 %   Abs Immature Granulocytes 0.03 0.00 - 0.07 K/uL    Comment: Performed at [facility], [address] [place], [place] [address]  Troponin T, High Sensitivity     Status: Abnormal   Collection Time: 02/08/24  1:55 PM  Result Value Ref Range   Troponin T High Sensitivity 20 (H) 0 - 19 ng/L    Comment: (NOTE) Biotin concentrations > 1000 ng/mL falsely decrease TnT results.  Serial cardiac troponin measurements are suggested.  Refer to the Links section for chest pain algorithms and additional  guidance. Performed at [facility], [address], [place], [place] [address]   Magnesium     Status: None   Collection Time: 02/08/24  1:55 PM  Result Value Ref Range   Magnesium 2.2 1.7 - 2.4 mg/dL    Comment: Performed at [facility], [address] [place], [place] [address]  Comprehensive metabolic panel     Status: Abnormal   Collection Time: 02/08/24  1:55 PM  Result Value Ref Range   Sodium 138 135 - 145 mmol/L   Potassium 3.8 3.5 - 5.1 mmol/L   Chloride 102 98 - 111 mmol/L   CO2 26 22 - 32 mmol/L   Glucose, Bld 102 (H) 70 - 99 mg/dL    Comment: Glucose reference range applies only to samples taken after fasting for at least 8 hours.   BUN 11 8 - 23 mg/dL   Creatinine, Ser 9.22 0.44 - 1.00 mg/dL   Calcium 9.0 8.9 - 89.6 mg/dL   Total Protein 6.9 6.5 - 8.1 g/dL   Albumin 3.8 3.5 - 5.0 g/dL   AST 22 15 - 41 U/L   ALT 23 0 - 44 U/L   Alkaline Phosphatase 67 38 - 126 U/L   Total Bilirubin 0.7 0.0 - 1.2 mg/dL   GFR,  Estimated >39 >39 mL/min    Comment: (NOTE) Calculated using the CKD-EPI Creatinine Equation (2021)    Anion gap 11 5 - 15    Comment: Performed at  [facility], [address] [place], [place] [address]  Pro Brain natriuretic peptide     Status: Abnormal   Collection Time: 02/08/24  1:55 PM  Result Value Ref Range   Pro Brain Natriuretic Peptide 2,077.0 (H) <300.0 pg/mL    Comment: (NOTE) Age Group        Cut-Points    Interpretation  < 50 years     450 pg/mL       NT-proBNP > 450 pg/mL indicates                                ADHF is likely              50 to 75 years  900 pg/mL      NT-proBNP > 900 pg/mL indicates          ADHF is likely  > 75 years      1800 pg/mL     NT-proBNP > 1800 pg/mL indicates          ADHF is likely                           All ages    Results between       Indeterminate. Further clinical             300 and the cut-   information is needed to determine            point for age group   if ADHF is present.                                                             Elecsys proBNP II/ Elecsys proBNP II STAT           Cut-Point                       Interpretation  300 pg/mL                    NT-proBNP <300pg/mL indicates                             ADHF is not likely  Performed at [facility], [address], [place], [place] [address]   Resp panel by RT-PCR (RSV, Flu A&B, Covid) Anterior Nasal Swab     Status: None   Collection Time: 02/08/24  3:27 PM   Specimen: Anterior Nasal Swab  Result Value Ref Range   SARS Coronavirus 2 by RT PCR NEGATIVE NEGATIVE    Comment: (NOTE) SARS-CoV-2 target nucleic acids are NOT DETECTED.  The SARS-CoV-2 RNA is generally detectable in upper respiratory specimens during the acute phase of infection. The lowest concentration of SARS-CoV-2 viral copies this assay can detect is 138 copies/mL. A negative result does not preclude SARS-Cov-2 infection and should not be  used as the sole basis for treatment or other patient management decisions. A negative result may occur with  improper specimen collection/handling, submission of specimen other than nasopharyngeal swab, presence of viral mutation(s) within the areas targeted by this assay, and inadequate number of viral  copies(<138 copies/mL). A negative result must be combined with clinical observations, patient history, and epidemiological information. The expected result is Negative.  Fact Sheet for Patients:  bloggercourse.com  Fact Sheet for Healthcare Providers:  seriousbroker.it  This test is no t yet approved or cleared by the [place]  FDA and  has been authorized for detection and/or diagnosis of SARS-CoV-2 by FDA under an Emergency Use Authorization (EUA). This EUA will remain  in effect (meaning this test can be used) for the duration of the COVID-19 declaration under Section 564(b)(1) of the Act, 21 U.S.C.section 360bbb-3(b)(1), unless the authorization is terminated  or revoked sooner.       Influenza A by PCR NEGATIVE NEGATIVE   Influenza B by PCR NEGATIVE NEGATIVE    Comment: (NOTE) The Xpert Xpress SARS-CoV-2/FLU/RSV plus assay is intended as an aid in the diagnosis of influenza from Nasopharyngeal swab specimens and should not be used as a sole basis for treatment. Nasal washings and aspirates are unacceptable for Xpert Xpress SARS-CoV-2/FLU/RSV testing.  Fact Sheet for Patients: bloggercourse.com  Fact Sheet for Healthcare Providers: seriousbroker.it  This test is not yet approved or cleared by the [place]  FDA and has been authorized for detection and/or diagnosis of SARS-CoV-2 by FDA under an Emergency Use Authorization (EUA). This EUA will remain in effect (meaning this test can be used) for the duration of the COVID-19 declaration under Section 564(b)(1) of the  Act, 21 U.S.C. section 360bbb-3(b)(1), unless the authorization is terminated or revoked.     Resp Syncytial Virus by PCR NEGATIVE NEGATIVE    Comment: (NOTE) Fact Sheet for Patients: bloggercourse.com  Fact Sheet for Healthcare Providers: seriousbroker.it  This test is not yet approved or cleared by the [place]  FDA and has been authorized for detection and/or diagnosis of SARS-CoV-2 by FDA under an Emergency Use Authorization (EUA). This EUA will remain in effect (meaning this test can be used) for the duration of the COVID-19 declaration under Section 564(b)(1) of the Act, 21 U.S.C. section 360bbb-3(b)(1), unless the authorization is terminated or revoked.  Performed at [facility], [address] [place], [place] [address]   MRSA Next Gen by PCR, Nasal     Status: None   Collection Time: 02/08/24  5:52 PM   Specimen: Nasal Mucosa; Nasal Swab  Result Value Ref Range   MRSA by PCR Next Gen NOT DETECTED NOT DETECTED    Comment: (NOTE) The GeneXpert MRSA Assay (FDA approved for NASAL specimens only), is one component of a comprehensive MRSA colonization surveillance program. It is not intended to diagnose MRSA infection nor to guide or monitor treatment for MRSA infections. Test performance is not FDA approved in patients less than 32 years old. Performed at [facility], [address], [place], [place] [address]   Troponin T, High Sensitivity     Status: None   Collection Time: 02/08/24  7:08 PM  Result Value Ref Range   Troponin T High Sensitivity 18 0 - 19 ng/L    Comment: (NOTE) Biotin concentrations > 1000 ng/mL falsely decrease TnT results.  Serial cardiac troponin measurements are suggested.  Refer to the Links section for chest pain algorithms and additional  guidance. Performed at [facility], [address], [place], [place] [address]   TSH      Status: None   Collection Time: 02/08/24  7:08 PM  Result Value Ref Range   TSH 1.350 0.350 - 4.500 uIU/mL    Comment: Performed  at [facility], [address], [place], [place] [address]  Comprehensive metabolic panel     Status: Abnormal   Collection Time: 02/09/24  2:14 AM  Result Value Ref Range   Sodium 135 135 - 145 mmol/L   Potassium 3.6 3.5 - 5.1 mmol/L   Chloride 102 98 - 111 mmol/L   CO2 23 22 - 32 mmol/L   Glucose, Bld 108 (H) 70 - 99 mg/dL    Comment: Glucose reference range applies only to samples taken after fasting for at least 8 hours.   BUN 10 8 - 23 mg/dL   Creatinine, Ser 9.29 0.44 - 1.00 mg/dL   Calcium 8.3 (L) 8.9 - 10.3 mg/dL   Total Protein 5.9 (L) 6.5 - 8.1 g/dL   Albumin 3.3 (L) 3.5 - 5.0 g/dL   AST 19 15 - 41 U/L   ALT 18 0 - 44 U/L   Alkaline Phosphatase 60 38 - 126 U/L   Total Bilirubin 0.4 0.0 - 1.2 mg/dL   GFR, Estimated >39 >39 mL/min    Comment: (NOTE) Calculated using the CKD-EPI Creatinine Equation (2021)    Anion gap 10 5 - 15    Comment: Performed at [facility], [address], [place], [place] [address]  Heparin  level (unfractionated)     Status: Abnormal   Collection Time: 02/09/24  2:14 AM  Result Value Ref Range   Heparin  Unfractionated 0.92 (H) 0.30 - 0.70 IU/mL    Comment: (NOTE) The clinical reportable range upper limit is being lowered to >1.10 to align with the FDA approved guidance for the current laboratory assay.  If heparin  results are below expected values, and patient dosage has  been confirmed, suggest follow up testing of antithrombin III levels. Performed at [facility], [address], [place], [place] [address]   CBC     Status: Abnormal   Collection Time: 02/09/24  2:14 AM  Result Value Ref Range   WBC 6.5 4.0 - 10.5 K/uL   RBC 3.64 (L) 3.87 - 5.11 MIL/uL   Hemoglobin 10.5 (L) 12.0 - 15.0 g/dL   HCT 68.0 (L) 63.9 - 53.9 %   MCV 87.6 80.0 - 100.0 fL    MCH 28.8 26.0 - 34.0 pg   MCHC 32.9 30.0 - 36.0 g/dL   RDW 86.2 88.4 - 84.4 %   Platelets 228 150 - 400 K/uL   nRBC 0.0 0.0 - 0.2 %    Comment: Performed at [facility], [address], [place], [place] [address]    ECG   Afib with RVR at 119 (12/13) - Personally Reviewed  Telemetry   Afib with CVR - Personally Reviewed  Radiology   CT Angio Chest PE W and/or Wo Contrast Addendum Date: 02/08/2024 ADDENDUM #1 ADDENDUM: Results were discussed with Amador Boning, MD, at 3:51 pm on 02/08/2024. ---------------------------------------------------- Electronically signed by: Greig Pique MD 02/08/2024 03:55 PM EST RP Workstation: [id number]   Result Date: 02/08/2024 ORIGINAL REPORTEXAM: CTA of the Chest with contrast for PE 02/08/2024 03:36:22 PM TECHNIQUE: CTA of the chest was performed after the administration of intravenous contrast. Multiplanar reformatted images are provided for review. MIP images are provided for review. Automated exposure control, iterative reconstruction, and/or weight based adjustment of the mA/kV was utilized to reduce the radiation dose to as low as reasonably achievable. COMPARISON: None available. CLINICAL HISTORY: Pulmonary embolism (PE) suspected, high prob. FINDINGS: PULMONARY ARTERIES: Pulmonary arteries are adequately opacified for evaluation. Segmental  pulmonary emboli within all lobes bilaterally. There is no central pulmonary embolism. Main pulmonary artery is normal in size. MEDIASTINUM: The heart is mildly enlarged. There is no pericardial effusion. Aorta is normal in size. There are atherosclerotic calcifications of the aorta. LYMPH NODES: No mediastinal, hilar or axillary lymphadenopathy. LUNGS AND PLEURA: There is a moderate sized right pleural effusion. There is atelectasis in the right lower lobe and minimally in the left lower lobe. Airspace and ground glass opacities are seen in the right middle lobe and inferior right lower lobe  as well as right upper lobe. Findings may be related to pulmonary infarcts. Multifocal pneumonia is not excluded. No pneumothorax. UPPER ABDOMEN: There are rounded hypodensities in the liver which are too small to characterize, likely cysts or hemangiomas. SOFT TISSUES AND BONES: No acute bone or soft tissue abnormality. IMPRESSION: 1. Segmental pulmonary emboli within all lobes bilaterally. No central pulmonary embolism. 2. Moderate sized right pleural effusion. 3. Airspace and ground glass opacities in the right middle lobe, inferior right lower lobe, and right upper lobe, possibly related to pulmonary infarcts. Multifocal pneumonia remains a consideration. Electronically signed by: Greig Pique MD 02/08/2024 03:49 PM EST RP Workstation: [id number]   DG Chest 2 View Result Date: 02/08/2024 EXAM: 2 VIEW(S) XRAY OF THE CHEST 02/08/2024 12:55:00 PM COMPARISON: 01/02/2023 CLINICAL HISTORY: 88 year old female with shortness of breath, reports recent right lower chest pain onset 3 days ago . FINDINGS: LUNGS AND PLEURA: Vailing and confluent new right lung base opacity obscuring the right hemidiaphragm, suspicious for combined pleural effusion and collapse or consolidation. Left lung appears stable, negative. No pulmonary edema. No pneumothorax. HEART AND MEDIASTINUM: Cardiomegaly. Ectatic and calcified aorta. BONES AND SOFT TISSUES: Thoracic degenerative changes. IMPRESSION: 1. Acute and confluent right lung base opacity, suspicious for combined pleural effusion and nonspecific airspace disease or consolidation. Electronically signed by: Helayne Hurst MD 02/08/2024 01:13 PM EST RP Workstation: [id number]    Cardiac Studies   Echo pending  Impression   Principal Problem:   Acute pulmonary embolism (HCC) Active Problems:   Primary open angle glaucoma (POAG) of both eyes, mild stage   Pure hypercholesterolemia   Paresthesia   New onset atrial fibrillation (HCC)   Atrial fibrillation with RVR  (HCC)   Recommendation   Acute subsegmental bilateral pulmonary emboli Noted on CT scan - explains pleuritic right-sided chest pain. Associated right pleural effusion. Had noted RLE swelling, therefore possible DVT. Plan for LE venous dopplers today. Continue IV heparin  for several days prior to switch to OAC once no further procedures planned and d/t large burden of clot.. Acute pulmonary edema/effusion This could be due to HFrEF or HFPEF related to afib and possible acute right heart failure associated with substantial PE burden. Echo pending today. ProBNP elevated- pleural effusion and LE edema. Recommend lasix  40 mg IV daily. Monitor renal function with diuresis. Consider repeat CXR in a few days to monitor pleural effusion with diuresis and determine if thoracentesis will be necessary. New onset atrial fibrillation Suspect related to significant PE burden - rates controlled on diltiazem  - if there is systolic heart failure (right or left) on echo, then would prefer BB for rate control.  On IV heparin .  Transition to [facility] close to d/c - this should be at the DVT/PE treatment dose which is higher than the afib dose for most DOACs.  Thanks for the consultation. Cardiology will follow with you.  Time Spent Directly with Patient:  I have spent a total of 65 minutes with  the patient reviewing hospital notes, telemetry, EKGs, labs and examining the patient as well as establishing an assessment and plan that was discussed personally with the patient.  > 50% of time was spent in direct patient care.  Length of Stay:  LOS: 0 days   Vinie KYM Maxcy, MD, [facility], FNLA, FACP  [place]  [facility]  Medical Director of the [facility] Lipid Disorders &  Cardiovascular Risk Reduction Clinic Diplomate of the American Board of Clinical Lipidology Attending Cardiologist  Direct Dial: [phone]  Fax: [phone]  Website:  [email or web address]   Vinie BROCKS Juwuan Sedita 02/09/2024, 8:56 AM        [1]  No current facility-administered medications on file prior to encounter.   Current Outpatient Medications on File Prior to Encounter  Medication Sig Dispense Refill   Cholecalciferol (VITAMIN D) 2000 UNITS CAPS Take 1 capsule by mouth daily.     Coenzyme Q10 (COQ-10 PO) Take by mouth.     LUMIGAN 0.01 % SOLN Place 1 drop into both eyes at bedtime.  6   Misc Natural Products (TOTAL MEMORY & FOCUS FORMULA PO) Take 2 tablets by mouth in the morning.     Multiple Vitamin (MULTIVITAMIN) capsule Take 1 capsule by mouth daily.     Albuterol-Budesonide (AIRSUPRA) 90-80 MCG/ACT AERO Inhale 2 puffs into the lungs 4 (four) times daily as needed (shorteness of breath). (Patient not taking: Reported on 02/08/2024)    [2]  Allergies Allergen Reactions   Penicillins

## 2024-02-10 ENCOUNTER — Inpatient Hospital Stay (HOSPITAL_COMMUNITY)

## 2024-02-10 DIAGNOSIS — M7989 Other specified soft tissue disorders: Secondary | ICD-10-CM

## 2024-02-10 DIAGNOSIS — I2699 Other pulmonary embolism without acute cor pulmonale: Secondary | ICD-10-CM

## 2024-02-10 DIAGNOSIS — I4891 Unspecified atrial fibrillation: Secondary | ICD-10-CM | POA: Diagnosis not present

## 2024-02-10 LAB — CBC
HCT: 31.8 % — ABNORMAL LOW (ref 36.0–46.0)
Hemoglobin: 10.4 g/dL — ABNORMAL LOW (ref 12.0–15.0)
MCH: 28.4 pg (ref 26.0–34.0)
MCHC: 32.7 g/dL (ref 30.0–36.0)
MCV: 86.9 fL (ref 80.0–100.0)
Platelets: 246 K/uL (ref 150–400)
RBC: 3.66 MIL/uL — ABNORMAL LOW (ref 3.87–5.11)
RDW: 13.5 % (ref 11.5–15.5)
WBC: 6.3 K/uL (ref 4.0–10.5)
nRBC: 0 % (ref 0.0–0.2)

## 2024-02-10 LAB — BASIC METABOLIC PANEL WITH GFR
Anion gap: 10 (ref 5–15)
BUN: 14 mg/dL (ref 8–23)
CO2: 25 mmol/L (ref 22–32)
Calcium: 8.3 mg/dL — ABNORMAL LOW (ref 8.9–10.3)
Chloride: 102 mmol/L (ref 98–111)
Creatinine, Ser: 0.89 mg/dL (ref 0.44–1.00)
GFR, Estimated: >60 mL/min (ref >60)
Glucose, Bld: 119 mg/dL — ABNORMAL HIGH (ref 70–99)
Potassium: 3.5 mmol/L (ref 3.5–5.1)
Sodium: 136 mmol/L (ref 135–145)

## 2024-02-10 LAB — HEPARIN LEVEL (UNFRACTIONATED)
Heparin Unfractionated: 0.54 [IU]/mL (ref 0.30–0.70)
Heparin Unfractionated: 0.57 [IU]/mL (ref 0.30–0.70)

## 2024-02-10 LAB — MAGNESIUM: Magnesium: 2.2 mg/dL (ref 1.7–2.4)

## 2024-02-10 MED ORDER — SENNOSIDES-DOCUSATE SODIUM 8.6-50 MG PO TABS
1.0000 | ORAL_TABLET | Freq: Two times a day (BID) | ORAL | Status: DC
  Administered 2024-02-10 – 2024-02-12 (×4): 1 via ORAL
  Filled 2024-02-10 (×4): qty 1

## 2024-02-10 MED ORDER — DILTIAZEM HCL ER COATED BEADS 180 MG PO CP24
180.0000 mg | ORAL_CAPSULE | Freq: Every day | ORAL | Status: DC
  Administered 2024-02-10 – 2024-02-12 (×3): 180 mg via ORAL
  Filled 2024-02-10 (×3): qty 1

## 2024-02-10 NOTE — Progress Notes (Signed)
 PHARMACY - ANTICOAGULATION CONSULT NOTE  Pharmacy Consult for Heparin  Indication: PE, afib  Allergies[1]  Patient Measurements: Height: 5' 3 (160 cm) Weight: 80.6 kg (177 lb 11.1 oz) IBW/kg (Calculated) : 52.4 HEPARIN  DW (KG): 70.1  Vital Signs: Temp: 99.1 F (37.3 C) (12/15 0725) Temp Source: Oral (12/15 0725) BP: 114/70 (12/15 0814) Pulse Rate: 78 (12/15 0814)  Labs: Recent Labs    02/08/24 1355 02/09/24 0214 02/09/24 1425 02/09/24 1525 02/10/24 0055 02/10/24 0849  HGB 12.3 10.5*  --   --  10.4*  --   HCT 37.8 31.9*  --   --  31.8*  --   PLT 228 228  --   --  246  --   HEPARINUNFRC  --  0.92*   < > 0.92* 0.54 0.57  CREATININE 0.77 0.70  --   --  0.89  --    < > = values in this interval not displayed.    Estimated Creatinine Clearance: 45.6 mL/min (by C-G formula based on SCr of 0.89 mg/dL).   Medical History: Past Medical History:  Diagnosis Date   Colon polyp    Fibroid      Assessment: AC/Heme: new BL PE, also has new onset afib  - Hep level 0.57 in goal, Hgb 10.4, Plts WNL  Goal of Therapy:  Heparin  level 0.3-0.7 units/ml Monitor platelets by anticoagulation protocol: Yes   Plan:  Con't IV heparin  1050 units/hr Daily heparin  level and CBC F/u dopplers to r/o DVT  Jadarian Mckay Karoline Marina, PharmD, BCPS Clinical Staff Pharmacist  Marina Salines Stillinger 02/10/2024,9:26 AM      [1]  Allergies Allergen Reactions   Penicillins

## 2024-02-10 NOTE — Progress Notes (Signed)
 PHARMACY - ANTICOAGULATION CONSULT NOTE  Pharmacy Consult for IV heparin  Indication: pulmonary embolus  Allergies[1]  Patient Measurements: Height: 5' 3 (160 cm) Weight: 80.7 kg (177 lb 14.6 oz) IBW/kg (Calculated) : 52.4 HEPARIN  DW (KG): 70.1  Vital Signs: Temp: 99.1 F (37.3 C) (12/14 2322) Temp Source: Oral (12/14 2322) BP: 129/71 (12/15 0000) Pulse Rate: 96 (12/15 0000)  Labs: Recent Labs    02/08/24 1355 02/09/24 0214 02/09/24 0214 02/09/24 1425 02/09/24 1525 02/10/24 0055  HGB 12.3 10.5*  --   --   --   --   HCT 37.8 31.9*  --   --   --   --   PLT 228 228  --   --   --   --   HEPARINUNFRC  --  0.92*   < > >1.10* 0.92* 0.54  CREATININE 0.77 0.70  --   --   --  0.89   < > = values in this interval not displayed.    Estimated Creatinine Clearance: 45.6 mL/min (by C-G formula based on SCr of 0.89 mg/dL).   Medical History: Past Medical History:  Diagnosis Date   Colon polyp    Fibroid     Medications:  Medications Prior to Admission  Medication Sig Dispense Refill Last Dose/Taking   Cholecalciferol (VITAMIN D) 2000 UNITS CAPS Take 1 capsule by mouth daily.   02/07/2024   Coenzyme Q10 (COQ-10 PO) Take by mouth.   02/07/2024   LUMIGAN 0.01 % SOLN Place 1 drop into both eyes at bedtime.  6 02/07/2024   Misc Natural Products (TOTAL MEMORY & FOCUS FORMULA PO) Take 2 tablets by mouth in the morning.   02/07/2024   Multiple Vitamin (MULTIVITAMIN) capsule Take 1 capsule by mouth daily.   Unknown   Albuterol-Budesonide (AIRSUPRA) 90-80 MCG/ACT AERO Inhale 2 puffs into the lungs 4 (four) times daily as needed (shorteness of breath). (Patient not taking: Reported on 02/08/2024)   Not Taking    Assessment: Pharmacy is consulted to start heparin  drip on 88 yo female sent to ED fro physician office for right sided breast pain radiating to the back. Pt not on a blood thinner and history of PE/DVT.      Today, 02/10/2024  Heparin  level 0.54 therapeutic on 1050  units/hr Per RN no bleeding CBC in process  Goal of Therapy:  Heparin  level 0.3-0.7 units/ml Monitor platelets by anticoagulation protocol: Yes   Plan:  Continue heparin  drip at 1050 units/hr Confirmatory Heparin  level in 8 hours  Daily CBC  Monitor for signs and symptoms of bleeding  Leeroy Mace RPh 02/10/2024, 1:36 AM         [1]  Allergies Allergen Reactions   Penicillins

## 2024-02-10 NOTE — Progress Notes (Signed)
 PROGRESS NOTE    Sarah Carr  FMW:[id number] DOB: 1935/10/23 DOA: 02/08/2024 PCP: Patient, No Pcp Per   Brief Narrative:  88 y.o. female with medical history significant of hyperlipidemia, colon cancer status post right hemicolectomy, glaucoma presented with chest pain.  On presentation, she was found to have A-fib with RVR requiring Cardizem  drip.  proBNP 2077, troponin of 20.  RSV/COVID/influenza PCR negative.  Chest x-ray showed right sided pleural effusion.  CTA PE showed bilateral segmental pulmonary emboli with moderate right pleural effusion and ground glass opacities representing infarct versus infection.  She was started on heparin  drip as well.  Cardiology was consulted.  Assessment & Plan:   Pulmonary embolism - Imaging as above.  Continue heparin  drip.  2D echo shows EF of 70 to 75%.  Lower extremity duplex pending.  Will switch to oral DOAC once it is determined that there is no need for any intervention  New onset paroxysmal A-fib with RVR - Currently on Cardizem  and heparin  drips.  Rate mostly controlled currently.  Cardiology following.  Moderate right pleural effusion - Questionable cause.  Respiratory status currently stable.  If respiratory status does not improve, might need thoracentesis - Concern for heart failure exacerbation: Patient has been started on IV Lasix .  Cardiology  Glaucoma -Continue home eyedrops   Obesity class I - Outpatient follow-up  Physical deconditioning - PT eval   DVT prophylaxis: Heparin  drip Code Status: Full Family Communication: 1 daughter at bedside and another daughter on phone Disposition Plan: Status is: inpatient because: Of severity of illness    Consultants: cardiology  Procedures: 2D echo  Antimicrobials: None   Subjective: Patient seen and examined at bedside.  Still short of breath with minimal exertion.  No fever, vomiting, abdominal pain reported. Objective: Vitals:   02/10/24 0545 02/10/24 0551  02/10/24 0600 02/10/24 0615  BP:  111/70 124/70 112/66  Pulse:  94 88 75  Resp: (!) 32 17 (!) 25 (!) 26  Temp:      TempSrc:      SpO2:  95% 92% (!) 87%  Weight:  80.6 kg    Height:        Intake/Output Summary (Last 24 hours) at 02/10/2024 0725 Last data filed at 02/10/2024 0600 Gross per 24 hour  Intake 491.29 ml  Output 1150 ml  Net -658.71 ml   Filed Weights   02/08/24 1632 02/08/24 1801 02/10/24 0551  Weight: 78.9 kg 80.7 kg 80.6 kg    Examination:  General: On room air.  No distress.  Elderly female lying in bed. ENT/neck: No thyromegaly.  JVD is not elevated  respiratory: Decreased breath sounds at bases bilaterally with some crackles; no wheezing.  Intermittently tachypneic CVS: S1-S2 heard, rate controlled currently Abdominal: Soft, nontender, slightly distended; no organomegaly, bowel sounds are heard Extremities: Trace lower extremity edema; no cyanosis  CNS: Awake and alert.  Remains slow to respond.  No focal neurologic deficit.  Moves extremities Lymph: No obvious lymphadenopathy Skin: No obvious ecchymosis/lesions  psych: Mostly flat affect.  Currently not agitated. musculoskeletal: No obvious joint swelling/deformity   Data Reviewed: I have personally reviewed following labs and imaging studies  CBC: Recent Labs  Lab 02/08/24 1355 02/09/24 0214 02/10/24 0055  WBC 7.0 6.5 6.3  NEUTROABS 4.3  --   --   HGB 12.3 10.5* 10.4*  HCT 37.8 31.9* 31.8*  MCV 87.5 87.6 86.9  PLT 228 228 246   Basic Metabolic Panel: Recent Labs  Lab 02/08/24 1355 02/09/24 0214  02/10/24 0055  NA 138 135 136  K 3.8 3.6 3.5  CL 102 102 102  CO2 26 23 25   GLUCOSE 102* 108* 119*  BUN 11 10 14   CREATININE 0.77 0.70 0.89  CALCIUM 9.0 8.3* 8.3*  MG 2.2  --  2.2   GFR: Estimated Creatinine Clearance: 45.6 mL/min (by C-G formula based on SCr of 0.89 mg/dL). Liver Function Tests: Recent Labs  Lab 02/08/24 1355 02/09/24 0214  AST 22 19  ALT 23 18  ALKPHOS 67 60   BILITOT 0.7 0.4  PROT 6.9 5.9*  ALBUMIN 3.8 3.3*   No results for input(s): LIPASE, AMYLASE in the last 168 hours. No results for input(s): AMMONIA in the last 168 hours. Coagulation Profile: No results for input(s): INR, PROTIME in the last 168 hours. Cardiac Enzymes: No results for input(s): CKTOTAL, CKMB, CKMBINDEX, TROPONINI in the last 168 hours. BNP (last 3 results) Recent Labs    02/08/24 1355  PROBNP 2,077.0*   HbA1C: No results for input(s): HGBA1C in the last 72 hours. CBG: No results for input(s): GLUCAP in the last 168 hours. Lipid Profile: No results for input(s): CHOL, HDL, LDLCALC, TRIG, CHOLHDL, LDLDIRECT in the last 72 hours. Thyroid Function Tests: Recent Labs    02/08/24 1908  TSH 1.350   Anemia Panel: No results for input(s): VITAMINB12, FOLATE, FERRITIN, TIBC, IRON, RETICCTPCT in the last 72 hours. Sepsis Labs: No results for input(s): PROCALCITON, LATICACIDVEN in the last 168 hours.  Recent Results (from the past 240 hours)  Resp panel by RT-PCR (RSV, Flu A&B, Covid) Anterior Nasal Swab     Status: None   Collection Time: 02/08/24  3:27 PM   Specimen: Anterior Nasal Swab  Result Value Ref Range Status   SARS Coronavirus 2 by RT PCR NEGATIVE NEGATIVE Final    Comment: (NOTE) SARS-CoV-2 target nucleic acids are NOT DETECTED.  The SARS-CoV-2 RNA is generally detectable in upper respiratory specimens during the acute phase of infection. The lowest concentration of SARS-CoV-2 viral copies this assay can detect is 138 copies/mL. A negative result does not preclude SARS-Cov-2 infection and should not be used as the sole basis for treatment or other patient management decisions. A negative result may occur with  improper specimen collection/handling, submission of specimen other than nasopharyngeal swab, presence of viral mutation(s) within the areas targeted by this assay, and inadequate number of  viral copies(<138 copies/mL). A negative result must be combined with clinical observations, patient history, and epidemiological information. The expected result is Negative.  Fact Sheet for Patients:  bloggercourse.com  Fact Sheet for Healthcare Providers:  seriousbroker.it  This test is no t yet approved or cleared by the [place]  FDA and  has been authorized for detection and/or diagnosis of SARS-CoV-2 by FDA under an Emergency Use Authorization (EUA). This EUA will remain  in effect (meaning this test can be used) for the duration of the COVID-19 declaration under Section 564(b)(1) of the Act, 21 U.S.C.section 360bbb-3(b)(1), unless the authorization is terminated  or revoked sooner.       Influenza A by PCR NEGATIVE NEGATIVE Final   Influenza B by PCR NEGATIVE NEGATIVE Final    Comment: (NOTE) The Xpert Xpress SARS-CoV-2/FLU/RSV plus assay is intended as an aid in the diagnosis of influenza from Nasopharyngeal swab specimens and should not be used as a sole basis for treatment. Nasal washings and aspirates are unacceptable for Xpert Xpress SARS-CoV-2/FLU/RSV testing.  Fact Sheet for Patients: bloggercourse.com  Fact Sheet for Healthcare Providers:  seriousbroker.it  This test is not yet approved or cleared by the [place]  FDA and has been authorized for detection and/or diagnosis of SARS-CoV-2 by FDA under an Emergency Use Authorization (EUA). This EUA will remain in effect (meaning this test can be used) for the duration of the COVID-19 declaration under Section 564(b)(1) of the Act, 21 U.S.C. section 360bbb-3(b)(1), unless the authorization is terminated or revoked.     Resp Syncytial Virus by PCR NEGATIVE NEGATIVE Final    Comment: (NOTE) Fact Sheet for Patients: bloggercourse.com  Fact Sheet for Healthcare  Providers: seriousbroker.it  This test is not yet approved or cleared by the [place]  FDA and has been authorized for detection and/or diagnosis of SARS-CoV-2 by FDA under an Emergency Use Authorization (EUA). This EUA will remain in effect (meaning this test can be used) for the duration of the COVID-19 declaration under Section 564(b)(1) of the Act, 21 U.S.C. section 360bbb-3(b)(1), unless the authorization is terminated or revoked.  Performed at [facility], [address] [place], [place] [address]   MRSA Next Gen by PCR, Nasal     Status: None   Collection Time: 02/08/24  5:52 PM   Specimen: Nasal Mucosa; Nasal Swab  Result Value Ref Range Status   MRSA by PCR Next Gen NOT DETECTED NOT DETECTED Final    Comment: (NOTE) The GeneXpert MRSA Assay (FDA approved for NASAL specimens only), is one component of a comprehensive MRSA colonization surveillance program. It is not intended to diagnose MRSA infection nor to guide or monitor treatment for MRSA infections. Test performance is not FDA approved in patients less than 26 years old. Performed at [facility], [address], [place], [place] [address]   Blood culture (routine x 2)     Status: None (Preliminary result)   Collection Time: 02/08/24  7:08 PM   Specimen: BLOOD RIGHT HAND  Result Value Ref Range Status   Specimen Description BLOOD RIGHT HAND  Final   Special Requests   Final    BOTTLES DRAWN AEROBIC ONLY Blood Culture adequate volume   Culture   Final    NO GROWTH < 24 HOURS Performed at [facility], [address], [place], [place] [address]    Report Status PENDING  Incomplete  Blood culture (routine x 2)     Status: None (Preliminary result)   Collection Time: 02/08/24  7:08 PM   Specimen: BLOOD RIGHT HAND  Result Value Ref Range Status   Specimen Description   Final    BLOOD RIGHT HAND AEROBIC BOTTLE ONLY Performed at [facility], [address] [place], [place] [address]    Special Requests   Final    Blood Culture adequate volume Performed at [facility], [address] [place], [place] [address]    Culture   Final    NO GROWTH < 24 HOURS Performed at [facility], [address] [place], [place] [address]    Report Status PENDING  Incomplete       Scheduled Meds:  Chlorhexidine  Gluconate Cloth  6 each Topical Daily   furosemide   40 mg Intravenous Daily   latanoprost   1 drop Both Eyes QHS   sodium chloride  flush  3 mL Intravenous Q12H   Continuous Infusions:  diltiazem  (CARDIZEM ) infusion 5 mg/hr (02/10/24 0600)   heparin  1,050 Units/hr (02/10/24 0600)          Sophie Mao, MD [facility] 02/10/2024, 7:25  AM

## 2024-02-10 NOTE — Progress Notes (Addendum)
°  Progress Note  Patient Name: Sarah Carr Date of Encounter: 02/10/2024 [facility] Cardiologist: Vinie JAYSON Maxcy, MD   Interval Summary   AFIB rate controlled. Spoke with family.   Vital Signs Vitals:   02/10/24 0700 02/10/24 0725 02/10/24 0800 02/10/24 0814  BP: 111/68  107/74 114/70  Pulse: 74  97 78  Resp: 16  18 17   Temp:  99.1 F (37.3 C)    TempSrc:  Oral    SpO2: 98%  97% 95%  Weight:      Height:        Intake/Output Summary (Last 24 hours) at 02/10/2024 1030 Last data filed at 02/10/2024 0959 Gross per 24 hour  Intake 442.6 ml  Output 1150 ml  Net -707.4 ml      02/10/2024    5:51 AM 02/08/2024    6:01 PM 02/08/2024    4:32 PM  Last 3 Weights  Weight (lbs) 177 lb 11.1 oz 177 lb 14.6 oz 173 lb 15.1 oz  Weight (kg) 80.6 kg 80.7 kg 78.9 kg      Telemetry/ECG  Previously A-fib with RVR 119- Personally Reviewed  Physical Exam  GEN: No acute distress.   Neck: No JVD Cardiac: Irreg , no murmurs, rubs, or gallops.  Respiratory: Blunted right base GI: Soft, nontender, non-distended  MS: No edema  Assessment & Plan  88 year old with acute subsegmental bilateral pulmonary emboli noted on CT scan, acute pulmonary edema/effusion, new onset atrial fibrillation  Paroxysmal atrial fibrillation - Rates are controlled on diltiazem  IV.  I will go ahead and change to p.o. diltiazem  CD 180 mg daily.  EF is normal at 70%.  Hyperdynamic.  No need to transition to beta-blocker.  Heart rate most recently 78.  Blood pressure 114/70.  Hemoglobin 10.4.  Pulmonary emboli/DVT - Currently IV heparin -plan switch to Eliquis  in near future.  Chest pain secondary to PE.    Pulmonary edema/pleural effusion - Likely secondary to PE burden.  Inflammatory response.  And possible degree of acute diastolic heart failure in this setting. - Lasix  40 mg IV daily   For questions or updates, please contact  [facility] Please consult [email or web address] for contact  info under         Signed, Oneil Parchment, MD

## 2024-02-10 NOTE — Evaluation (Signed)
 Physical Therapy Evaluation Patient Details Name: Sarah Carr MRN: [id number] DOB: 1935/11/10 Today's Date: 02/10/2024  History of Present Illness  88 y.o. female  presents with Chest pain,she was found to have A-fib with RVR. CTA PE showed bilateral segmental pulmonary emboli with moderate right pleural effusion and ground glass opacities representing infarct versus infection. EFY:ybezmopepizfpj, colon cancer, status post right hemicolectomy, glaucoma     Clinical Impression    Pt admitted with above diagnosis.  Pt currently with functional limitations due to the deficits listed below (see PT Problem List). Pt will benefit from acute skilled PT to increase their independence and safety with mobility to allow discharge.   The patient reports feeling weak but agreeable to short ambulation in room with min assistance. Patient maintained on 2 LPM, SPo2  remained  > 98%, HR max 108.  Patient should progress to return home with support of family.  Patient was independent, resides with spouse who uses a  RW. Family supportive.  Recmmend HHPT      If plan is discharge home, recommend the following: A little help with bathing/dressing/bathroom;Assistance with cooking/housework;Help with stairs or ramp for entrance;Assist for transportation   Can travel by private vehicle    yes    Equipment Recommendations  (TBD)  Recommendations for Other Services    OT   Functional Status Assessment Patient has had a recent decline in their functional status and demonstrates the ability to make significant improvements in function in a reasonable and predictable amount of time.     Precautions / Restrictions   Monitor SPOt and HR     Mobility  Bed Mobility Overal bed mobility: Needs Assistance Bed Mobility: Supine to Sit     Supine to sit: Supervision          Transfers   Equipment used: 1 person hand held assist               General transfer comment: support on bed to push to  stand    Ambulation/Gait Ambulation/Gait assistance: Min assist Gait Distance (Feet): 12 Feet Assistive device: 1 person hand held assist Gait Pattern/deviations: Step-to pattern, Step-through pattern Gait velocity: decr     General Gait Details: HHA and IV pole  Stairs            Wheelchair Mobility     Tilt Bed    Modified Rankin (Stroke Patients Only)       Balance Overall balance assessment: Mild deficits observed, not formally tested                                           Pertinent Vitals/Pain Pain Assessment Pain Assessment: No/denies pain    Home Living Family/patient expects to be discharged to:: Private residence Living Arrangements: Spouse/significant other;Children Available Help at Discharge: Family Type of Home: House Home Access: Level entry       Home Layout: One level Home Equipment: None Additional Comments: spouse on a Rw, unable to assit    Prior Function Prior Level of Function : Independent/Modified Independent;Driving                     Extremity/Trunk Assessment   Upper Extremity Assessment Upper Extremity Assessment: Overall WFL for tasks assessed    Lower Extremity Assessment Lower Extremity Assessment: Generalized weakness    Cervical / Trunk Assessment Cervical / Trunk Assessment: Normal  Communication   Communication Communication: No apparent difficulties    Cognition Arousal: Alert Behavior During Therapy: WFL for tasks assessed/performed   PT - Cognitive impairments: No apparent impairments                         Following commands: Intact       Cueing       General Comments      Exercises     Assessment/Plan    PT Assessment Patient needs continued PT services  PT Problem List Decreased strength;Decreased activity tolerance;Decreased mobility;Decreased knowledge of use of DME;Decreased safety awareness;Decreased knowledge of precautions;Cardiopulmonary  status limiting activity       PT Treatment Interventions DME instruction;Gait training;Functional mobility training;Therapeutic activities;Therapeutic exercise;Patient/family education    PT Goals (Current goals can be found in the Care Plan section)  Acute Rehab PT Goals Patient Stated Goal: home PT Goal Formulation: With patient/family Time For Goal Achievement: 02/24/24 Potential to Achieve Goals: Good    Frequency Min 3X/week     Co-evaluation               AM-PAC PT 6 Clicks Mobility  Outcome Measure Help needed turning from your back to your side while in a flat bed without using bedrails?: None Help needed moving from lying on your back to sitting on the side of a flat bed without using bedrails?: None Help needed moving to and from a bed to a chair (including a wheelchair)?: A Little Help needed standing up from a chair using your arms (e.g., wheelchair or bedside chair)?: A Little Help needed to walk in hospital room?: A Lot Help needed climbing 3-5 steps with a railing? : A Lot 6 Click Score: 18    End of Session Equipment Utilized During Treatment: Oxygen Activity Tolerance: Patient tolerated treatment well Patient left: in chair;with call bell/phone within reach;with chair alarm set;with family/visitor present Nurse Communication: Mobility status PT Visit Diagnosis: Unsteadiness on feet (R26.81)    Time: 8859-8841 PT Time Calculation (min) (ACUTE ONLY): 18 min   Charges:   PT Evaluation $PT Eval Low Complexity: 1 Low   PT General Charges $$ ACUTE PT VISIT: 1 Visit         Darice Potters PT Acute Rehabilitation Services Office [phone]   Potters Darice Norris 02/10/2024, 2:16 PM

## 2024-02-10 NOTE — Progress Notes (Signed)
 VASCULAR LAB    Bilateral lower extremity venous duplex has been performed.  See CV proc for preliminary results.   Jezlyn Westerfield, RVT 02/10/2024, 9:22 AM

## 2024-02-11 LAB — BASIC METABOLIC PANEL WITH GFR
Anion gap: 9 (ref 5–15)
BUN: 13 mg/dL (ref 8–23)
CO2: 27 mmol/L (ref 22–32)
Calcium: 8.4 mg/dL — ABNORMAL LOW (ref 8.9–10.3)
Chloride: 102 mmol/L (ref 98–111)
Creatinine, Ser: 0.81 mg/dL (ref 0.44–1.00)
GFR, Estimated: >60 mL/min (ref >60)
Glucose, Bld: 96 mg/dL (ref 70–99)
Potassium: 3.3 mmol/L — ABNORMAL LOW (ref 3.5–5.1)
Sodium: 138 mmol/L (ref 135–145)

## 2024-02-11 LAB — CBC
HCT: 31.9 % — ABNORMAL LOW (ref 36.0–46.0)
Hemoglobin: 10.6 g/dL — ABNORMAL LOW (ref 12.0–15.0)
MCH: 28.6 pg (ref 26.0–34.0)
MCHC: 33.2 g/dL (ref 30.0–36.0)
MCV: 86 fL (ref 80.0–100.0)
Platelets: 268 K/uL (ref 150–400)
RBC: 3.71 MIL/uL — ABNORMAL LOW (ref 3.87–5.11)
RDW: 13.3 % (ref 11.5–15.5)
WBC: 5.2 K/uL (ref 4.0–10.5)
nRBC: 0 % (ref 0.0–0.2)

## 2024-02-11 LAB — HEPARIN LEVEL (UNFRACTIONATED): Heparin Unfractionated: 0.45 [IU]/mL (ref 0.30–0.70)

## 2024-02-11 LAB — MAGNESIUM: Magnesium: 2.2 mg/dL (ref 1.7–2.4)

## 2024-02-11 MED ORDER — APIXABAN 5 MG PO TABS: 5.0000 mg | ORAL_TABLET | Freq: Two times a day (BID) | ORAL | Status: DC

## 2024-02-11 MED ORDER — APIXABAN 5 MG PO TABS
10.0000 mg | ORAL_TABLET | Freq: Two times a day (BID) | ORAL | Status: DC
  Administered 2024-02-11 – 2024-02-12 (×3): 10 mg via ORAL
  Filled 2024-02-11 (×3): qty 2

## 2024-02-11 MED ORDER — POTASSIUM CHLORIDE CRYS ER 20 MEQ PO TBCR
40.0000 meq | EXTENDED_RELEASE_TABLET | Freq: Once | ORAL | Status: AC
  Administered 2024-02-11: 10:00:00 40 meq via ORAL
  Filled 2024-02-11: qty 2

## 2024-02-11 NOTE — Discharge Instructions (Addendum)
 Information on my medicine - ELIQUIS  (apixaban ) This medication education was reviewed with me or my healthcare representative as part of my discharge preparation.    Why was Eliquis  prescribed for you? Eliquis  was prescribed to treat blood clots that may have been found in the veins of your legs (deep vein thrombosis) or in your lungs (pulmonary embolism) and to reduce the risk of them occurring again.  What do You need to know about Eliquis  ? The starting dose is 10 mg (two 5 mg tablets) taken TWICE daily for the FIRST SEVEN (7) DAYS, then on 12/23  the dose is reduced to ONE 5 mg tablet taken TWICE daily.  Eliquis  may be taken with or without food.   Try to take the dose about the same time in the morning and in the evening. If you have difficulty swallowing the tablet whole please discuss with your pharmacist how to take the medication safely.  Take Eliquis  exactly as prescribed and DO NOT stop taking Eliquis  without talking to the doctor who prescribed the medication.  Stopping may increase your risk of developing a new blood clot.  Refill your prescription before you run out.  After discharge, you should have regular check-up appointments with your healthcare provider that is prescribing your Eliquis .    What do you do if you miss a dose? If a dose of ELIQUIS  is not taken at the scheduled time, take it as soon as possible on the same day and twice-daily administration should be resumed. The dose should not be doubled to make up for a missed dose.  Important Safety Information A possible side effect of Eliquis  is bleeding. You should call your healthcare provider right away if you experience any of the following: Bleeding from an injury or your nose that does not stop. Unusual colored urine (red or dark brown) or unusual colored stools (red or black). Unusual bruising for unknown reasons. A serious fall or if you hit your head (even if there is no bleeding).  Some medicines  may interact with Eliquis  and might increase your risk of bleeding or clotting while on Eliquis . To help avoid this, consult your healthcare provider or pharmacist prior to using any new prescription or non-prescription medications, including herbals, vitamins, non-steroidal anti-inflammatory drugs (NSAIDs) and supplements.  This website has more information on Eliquis  (apixaban ): http://www.eliquis .com/eliquis dena

## 2024-02-11 NOTE — Evaluation (Signed)
 Occupational Therapy Evaluation Patient Details Name: Sarah Carr MRN: [id number] DOB: 17-Apr-1935 Today's Date: 02/11/2024   History of Present Illness   88 y.o. female  presents with Chest pain,she was found to have A-fib with RVR. CTA PE showed bilateral segmental pulmonary emboli with moderate right pleural effusion and ground glass opacities representing infarct versus infection. EFY:ybezmopepizfpj, colon cancer, status post right hemicolectomy, glaucoma     Clinical Impressions PTA, patient lives at home with husband and was completely independent for all aspects of A/IADL's and mobility including driving and working FT as radio producer. 3 daughters were bedside in ICU room for session and education and all actively participating in care. Patient had just returned from amb through hallway loop of entire ICU with nursing staff and OT session completed psot. Patient on RA with sats 94-96% on RA, HR 76-80 bpm and no LOB noted when tested. Currently, patient presents with deficits outlined below (see OT Problem List for details) most significantly mild higher level balance and activity tolerance deficits limiting BADL's and functional mobility performance which is sub-baseline. Initiated ECT education and recommending HHOT services upon discharge from acute setting. Patient requires continued Acute care hospital level OT services to progress safety and functional performance and allow for discharge.       If plan is discharge home, recommend the following:   A little help with walking and/or transfers;A little help with bathing/dressing/bathroom;Assistance with cooking/housework;Assist for transportation;Help with stairs or ramp for entrance     Functional Status Assessment   Patient has had a recent decline in their functional status and demonstrates the ability to make significant improvements in function in a reasonable and predictable amount of time.     Equipment  Recommendations   None recommended by OT      Precautions/Restrictions   Precautions Precaution/Restrictions Comments: monitor SPO2 and HR Restrictions Weight Bearing Restrictions Per Provider Order: No     Mobility Bed Mobility Overal bed mobility:  (was up in recliner and remained post session)                  Transfers Overall transfer level: Needs assistance Equipment used: 1 person hand held assist Transfers: Sit to/from Stand, Bed to chair/wheelchair/BSC Sit to Stand: Supervision     Step pivot transfers: Supervision     General transfer comment: for lines      Balance Overall balance assessment: Mild deficits observed, not formally tested                                         ADL either performed or assessed with clinical judgement   ADL Overall ADL's : Modified independent                                       General ADL Comments: assist only for lines, able to figure 4 for LB self care and perform standing functional reach withpout LOB, initiated ECT education with family     Vision Baseline Vision/History: 0 No visual deficits;1 Wears glasses              Pertinent Vitals/Pain Pain Assessment Pain Assessment: No/denies pain     Extremity/Trunk Assessment Upper Extremity Assessment Upper Extremity Assessment: Right hand dominant;Overall [facility] for tasks assessed   Lower Extremity Assessment Lower Extremity Assessment:  Defer to PT evaluation   Cervical / Trunk Assessment Cervical / Trunk Assessment: Normal   Communication Communication Communication: No apparent difficulties   Cognition Arousal: Alert Behavior During Therapy: WFL for tasks assessed/performed Cognition: No apparent impairments                               Following commands: Intact       Cueing  General Comments      remained on RA for session with 95% SPO2 and HR 76 bpm, no edema or skin issues            Home Living Family/patient expects to be discharged to:: Private residence Living Arrangements: Spouse/significant other;Children Available Help at Discharge: Family Type of Home: House Home Access: Level entry     Home Layout: One level     Bathroom Shower/Tub: Producer, Television/film/video: Standard     Home Equipment: Information systems manager;Other (comment);Adaptive equipment (suction grab bars) Adaptive Equipment: Reacher Additional Comments: spouse on a Rw, unable to assist      Prior Functioning/Environment Prior Level of Function : Independent/Modified Independent;Driving               ADLs Comments: currently works as professor at [employer]    OT Problem List: Cardiopulmonary status limiting activity;Decreased activity tolerance;Impaired balance (sitting and/or standing)   OT Treatment/Interventions: Self-care/ADL training;Therapeutic exercise;Energy conservation;Therapeutic activities;Patient/family education;Balance training      OT Goals(Current goals can be found in the care plan section)   Acute Rehab OT Goals Patient Stated Goal: to return to my indep working and driving OT Goal Formulation: With patient/family Time For Goal Achievement: 02/25/24 Potential to Achieve Goals: Good ADL Goals Pt Will Transfer to Toilet: with modified independence;regular height toilet;ambulating Pt Will Perform Toileting - Clothing Manipulation and hygiene: with modified independence;sit to/from stand Pt Will Perform Tub/Shower Transfer: Shower transfer;shower seat;ambulating;with modified independence Pt/caregiver will Perform Home Exercise Program: Both right and left upper extremity;With theraband;Independently;With written HEP provided Additional ADL Goal #1: Patient will teach back ECT strategies for ADL's and mobility indep   OT Frequency:  Min 2X/week       AM-PAC OT 6 Clicks Daily Activity     Outcome Measure Help from another person eating meals?: None Help  from another person taking care of personal grooming?: None Help from another person toileting, which includes using toliet, bedpan, or urinal?: None Help from another person bathing (including washing, rinsing, drying)?: None Help from another person to put on and taking off regular upper body clothing?: None Help from another person to put on and taking off regular lower body clothing?: None 6 Click Score: 24   End of Session Equipment Utilized During Treatment: Gait belt Nurse Communication: Mobility status  Activity Tolerance: Patient tolerated treatment well Patient left: in chair;with call bell/phone within reach;with chair alarm set;with family/visitor present  OT Visit Diagnosis: Unsteadiness on feet (R26.81)                Time: 1322-1350 OT Time Calculation (min): 28 min Charges:  OT General Charges $OT Visit: 1 Visit OT Evaluation $OT Eval Low Complexity: 1 Low  Natayah Warmack OT/L Acute Rehabilitation Department  [phone]  02/11/2024, 5:06 PM

## 2024-02-11 NOTE — Progress Notes (Signed)
 PROGRESS NOTE    Sarah Carr  FMW:[id number] DOB: Mar 24, 1935 DOA: 02/08/2024 PCP: Patient, No Pcp Per   Brief Narrative:  88 y.o. female with medical history significant of hyperlipidemia, colon cancer status post right hemicolectomy, glaucoma presented with chest pain.  On presentation, she was found to have A-fib with RVR requiring Cardizem  drip.  proBNP 2077, troponin of 20.  RSV/COVID/influenza PCR negative.  Chest x-ray showed right sided pleural effusion.  CTA PE showed bilateral segmental pulmonary emboli with moderate right pleural effusion and ground glass opacities representing infarct versus infection.  She was started on heparin  drip as well.  Cardiology was consulted.  Assessment & Plan:   Pulmonary embolism Acute left lower extremity DVT - Imaging as above.  Currently on heparin  drip.  2D echo shows EF of 70 to 75%.  Lower extremity duplex showed acute DVT involving the left posterior tibial veins and left peroneal veins.  Will switch to Eliquis , most likely today  New onset paroxysmal A-fib with RVR - Currently on heparin  drip.  Rate mostly controlled currently.  Cardiology following.  Patient has been switched to oral Cardizem  from Cardizem  drip by cardiology.  Acute diastolic heart failure Moderate right pleural effusion - Questionable cause.  Respiratory status currently stable.  If respiratory status does not improve, might need thoracentesis - Diuretics as per cardiology.  Strict input and output, daily weights, fluid restriction.  Glaucoma -Continue home eyedrops   Obesity class I - Outpatient follow-up  Physical deconditioning - PT recommending home health PT   DVT prophylaxis: Heparin  drip Code Status: Full Family Communication: daughters on phone Disposition Plan: Status is: inpatient because: Of severity of illness    Consultants: cardiology  Procedures: 2D echo  Antimicrobials: None   Subjective: Patient seen and examined at bedside.   Feels slightly better.  Denies any current chest pain.  No vomiting, abdominal pain reported.   Objective: Vitals:   02/10/24 2325 02/11/24 0000 02/11/24 0320 02/11/24 0500  BP:      Pulse:      Resp:      Temp: 98.2 F (36.8 C)  98.2 F (36.8 C)   TempSrc: Oral  Oral   SpO2:  96%    Weight:    78.8 kg  Height:        Intake/Output Summary (Last 24 hours) at 02/11/2024 0721 Last data filed at 02/11/2024 0426 Gross per 24 hour  Intake 365.19 ml  Output 1450 ml  Net -1084.81 ml   Filed Weights   02/08/24 1801 02/10/24 0551 02/11/24 0500  Weight: 80.7 kg 80.6 kg 78.8 kg    Examination:  General: No acute distress.  Currently on room air.  Elderly female lying in bed. ENT/neck: No palpable neck mass no JVD elevation respiratory: Bilateral decreased breath sounds at bases with scattered crackles with intermittent tachypnea  CVS: Rate mostly controlled; S1 and S2 are heard Abdominal: Soft, nontender, distended mildly; no organomegaly, bowel sounds are heard normally Extremities: No clubbing; mild extremity edema CNS: Alert and oriented.  Still slow to respond.  No focal neurologic deficit.  Able to move extremities Lymph: No obvious palpable lymphadenopathy Skin: No obvious petechiae/rashes psych: Flat affect mostly; not agitated  musculoskeletal: No obvious joint tenderness/erythema  Data Reviewed: I have personally reviewed following labs and imaging studies  CBC: Recent Labs  Lab 02/08/24 1355 02/09/24 0214 02/10/24 0055 02/11/24 0507  WBC 7.0 6.5 6.3 5.2  NEUTROABS 4.3  --   --   --  HGB 12.3 10.5* 10.4* 10.6*  HCT 37.8 31.9* 31.8* 31.9*  MCV 87.5 87.6 86.9 86.0  PLT 228 228 246 268   Basic Metabolic Panel: Recent Labs  Lab 02/08/24 1355 02/09/24 0214 02/10/24 0055  NA 138 135 136  K 3.8 3.6 3.5  CL 102 102 102  CO2 26 23 25   GLUCOSE 102* 108* 119*  BUN 11 10 14   CREATININE 0.77 0.70 0.89  CALCIUM 9.0 8.3* 8.3*  MG 2.2  --  2.2    GFR: Estimated Creatinine Clearance: 45.1 mL/min (by C-G formula based on SCr of 0.89 mg/dL). Liver Function Tests: Recent Labs  Lab 02/08/24 1355 02/09/24 0214  AST 22 19  ALT 23 18  ALKPHOS 67 60  BILITOT 0.7 0.4  PROT 6.9 5.9*  ALBUMIN 3.8 3.3*   No results for input(s): LIPASE, AMYLASE in the last 168 hours. No results for input(s): AMMONIA in the last 168 hours. Coagulation Profile: No results for input(s): INR, PROTIME in the last 168 hours. Cardiac Enzymes: No results for input(s): CKTOTAL, CKMB, CKMBINDEX, TROPONINI in the last 168 hours. BNP (last 3 results) Recent Labs    02/08/24 1355  PROBNP 2,077.0*   HbA1C: No results for input(s): HGBA1C in the last 72 hours. CBG: No results for input(s): GLUCAP in the last 168 hours. Lipid Profile: No results for input(s): CHOL, HDL, LDLCALC, TRIG, CHOLHDL, LDLDIRECT in the last 72 hours. Thyroid Function Tests: Recent Labs    02/08/24 1908  TSH 1.350   Anemia Panel: No results for input(s): VITAMINB12, FOLATE, FERRITIN, TIBC, IRON, RETICCTPCT in the last 72 hours. Sepsis Labs: No results for input(s): PROCALCITON, LATICACIDVEN in the last 168 hours.  Recent Results (from the past 240 hours)  Resp panel by RT-PCR (RSV, Flu A&B, Covid) Anterior Nasal Swab     Status: None   Collection Time: 02/08/24  3:27 PM   Specimen: Anterior Nasal Swab  Result Value Ref Range Status   SARS Coronavirus 2 by RT PCR NEGATIVE NEGATIVE Final    Comment: (NOTE) SARS-CoV-2 target nucleic acids are NOT DETECTED.  The SARS-CoV-2 RNA is generally detectable in upper respiratory specimens during the acute phase of infection. The lowest concentration of SARS-CoV-2 viral copies this assay can detect is 138 copies/mL. A negative result does not preclude SARS-Cov-2 infection and should not be used as the sole basis for treatment or other patient management decisions. A negative result  may occur with  improper specimen collection/handling, submission of specimen other than nasopharyngeal swab, presence of viral mutation(s) within the areas targeted by this assay, and inadequate number of viral copies(<138 copies/mL). A negative result must be combined with clinical observations, patient history, and epidemiological information. The expected result is Negative.  Fact Sheet for Patients:  bloggercourse.com  Fact Sheet for Healthcare Providers:  seriousbroker.it  This test is no t yet approved or cleared by the [place]  FDA and  has been authorized for detection and/or diagnosis of SARS-CoV-2 by FDA under an Emergency Use Authorization (EUA). This EUA will remain  in effect (meaning this test can be used) for the duration of the COVID-19 declaration under Section 564(b)(1) of the Act, 21 U.S.C.section 360bbb-3(b)(1), unless the authorization is terminated  or revoked sooner.       Influenza A by PCR NEGATIVE NEGATIVE Final   Influenza B by PCR NEGATIVE NEGATIVE Final    Comment: (NOTE) The Xpert Xpress SARS-CoV-2/FLU/RSV plus assay is intended as an aid in the diagnosis of influenza from Nasopharyngeal  swab specimens and should not be used as a sole basis for treatment. Nasal washings and aspirates are unacceptable for Xpert Xpress SARS-CoV-2/FLU/RSV testing.  Fact Sheet for Patients: bloggercourse.com  Fact Sheet for Healthcare Providers: seriousbroker.it  This test is not yet approved or cleared by the [place]  FDA and has been authorized for detection and/or diagnosis of SARS-CoV-2 by FDA under an Emergency Use Authorization (EUA). This EUA will remain in effect (meaning this test can be used) for the duration of the COVID-19 declaration under Section 564(b)(1) of the Act, 21 U.S.C. section 360bbb-3(b)(1), unless the authorization is terminated  or revoked.     Resp Syncytial Virus by PCR NEGATIVE NEGATIVE Final    Comment: (NOTE) Fact Sheet for Patients: bloggercourse.com  Fact Sheet for Healthcare Providers: seriousbroker.it  This test is not yet approved or cleared by the [place]  FDA and has been authorized for detection and/or diagnosis of SARS-CoV-2 by FDA under an Emergency Use Authorization (EUA). This EUA will remain in effect (meaning this test can be used) for the duration of the COVID-19 declaration under Section 564(b)(1) of the Act, 21 U.S.C. section 360bbb-3(b)(1), unless the authorization is terminated or revoked.  Performed at [facility], [address], [place], [place] [address]   MRSA Next Gen by PCR, Nasal     Status: None   Collection Time: 02/08/24  5:52 PM   Specimen: Nasal Mucosa; Nasal Swab  Result Value Ref Range Status   MRSA by PCR Next Gen NOT DETECTED NOT DETECTED Final    Comment: (NOTE) The GeneXpert MRSA Assay (FDA approved for NASAL specimens only), is one component of a comprehensive MRSA colonization surveillance program. It is not intended to diagnose MRSA infection nor to guide or monitor treatment for MRSA infections. Test performance is not FDA approved in patients less than 42 years old. Performed at [facility], [address], [place], [place] [address]   Blood culture (routine x 2)     Status: None (Preliminary result)   Collection Time: 02/08/24  7:08 PM   Specimen: BLOOD RIGHT HAND  Result Value Ref Range Status   Specimen Description BLOOD RIGHT HAND  Final   Special Requests   Final    BOTTLES DRAWN AEROBIC ONLY Blood Culture adequate volume   Culture   Final    NO GROWTH 2 DAYS Performed at [facility], [address], [place], [place] [address]    Report Status PENDING  Incomplete  Blood culture (routine x 2)     Status: None (Preliminary result)    Collection Time: 02/08/24  7:08 PM   Specimen: BLOOD RIGHT HAND  Result Value Ref Range Status   Specimen Description   Final    BLOOD RIGHT HAND AEROBIC BOTTLE ONLY Performed at [facility], [address], [place], [place] [address]    Special Requests   Final    Blood Culture adequate volume Performed at [facility], [address], [place], [place] [address]    Culture   Final    NO GROWTH 2 DAYS Performed at [facility], [address], [place], [place] [address]    Report Status PENDING  Incomplete       Scheduled Meds:  Chlorhexidine  Gluconate Cloth  6 each Topical Daily   diltiazem   180 mg Oral Daily   furosemide   40 mg Intravenous Daily   latanoprost   1 drop Both Eyes QHS   senna-docusate  1 tablet Oral BID   sodium chloride  flush  3 mL Intravenous Q12H   Continuous Infusions:  heparin  1,050 Units/hr (02/11/24 0426)          Sophie Mao, MD [facility] 02/11/2024, 7:21 AM

## 2024-02-11 NOTE — Progress Notes (Addendum)
 Progress Note  Patient Name: Sarah Carr Date of Encounter: 02/11/2024 [facility] Cardiologist: Vinie JAYSON Maxcy, MD   Interval Summary   Patient converted to sinus rhythm around 3 AM today.  She is unaware of arrhythmia.  Family was called during interview.  She has no complaints today no chest pain or shortness of breath.  Vital Signs Vitals:   02/10/24 2325 02/11/24 0000 02/11/24 0320 02/11/24 0500  BP:      Pulse:      Resp:      Temp: 98.2 F (36.8 C)  98.2 F (36.8 C)   TempSrc: Oral  Oral   SpO2:  96%    Weight:    78.8 kg  Height:       Currently on IV heparin  Intake/Output Summary (Last 24 hours) at 02/11/2024 0855 Last data filed at 02/11/2024 0426 Gross per 24 hour  Intake 365.19 ml  Output 1450 ml  Net -1084.81 ml      02/11/2024    5:00 AM 02/10/2024    5:51 AM 02/08/2024    6:01 PM  Last 3 Weights  Weight (lbs) 173 lb 11.6 oz 177 lb 11.1 oz 177 lb 14.6 oz  Weight (kg) 78.8 kg 80.6 kg 80.7 kg      Telemetry/ECG  Currently sinus rhythm, converted around 0300- Personally Reviewed  Physical Exam  GEN: No acute distress.   Neck: No JVD Cardiac: RRR, no murmurs, rubs, or gallops.  Respiratory: Clear to auscultation bilaterally. GI: Soft, nontender, non-distended  MS: No edema  Patient Profile Patient with past medical history significant for colon cancer status post right Heema colectomy, hyperlipidemia, glaucoma.  Patient admitted for acute DVT/PE with moderate right sided pleural effusion.  Found to be in new onset A-fib RVR on arrival.  Assessment & Plan   PAF -01/2024 echocardiogram on admission EF 70 to 75%.  Mild LVH.  Normal RV.  Severely dilated atria.  No significant valve disease. Patient converted to sinus rhythm today at 0300 on her own.  She has significantly dilated atria so may have been in this for a while.  No RV strain noted. Currently on IV heparin , plans to transition to DOAC eventually. Continue diltiazem  180  mg daily. TSH normal here.  Acute PE/DVT CTA demonstrated bilateral segmental PE with acute DVT noted as well.  Seems unprovoked.  No recent travel, not bedridden and very active, not on estrogen. On IV heparin  with plans to transition to Eliquis . Will defer to primary team for duration of therapy.  Moderate right-sided pleural effusion No respiratory complaints.  She has been diuresed on IV Lasix  40 mg daily.   May consider repeating imaging to see if this is clearing and/or she has indications for thoracentesis.  After that suspect that she could transition to DOAC. Currently on IV Lasix  40 mg, looks euvolemic otherwise with no respiratory symptoms.  Suspect we could probably discontinue after seen if pleural effusion has resolved. Replete K.   For questions or updates, please contact [facility] Please consult [email or web address] for contact info under       Signed, Thom LITTIE Sluder, PA-C     Personally seen and examined. Agree with above.  Paroxysmal atrial fibrillation-converted back to normal sinus rhythm earlier this morning at 3 AM.  Recommend transition to Eliquis  when able.  Continue with diltiazem  CD1 180 mg daily.  PE DVT-currently on IV heparin  transition to Eliquis  when able.  Right-sided pleural effusion-has received IV Lasix  40 mg daily  and appears comfortable.  Would not suspect she will require thoracentesis.  Oneil Parchment, MD

## 2024-02-11 NOTE — Progress Notes (Addendum)
 PHARMACY - ANTICOAGULATION CONSULT NOTE  Pharmacy Consult for Heparin  Indication: PE, DVT, Afib  Allergies[1]  Patient Measurements: Height: 5' 3 (160 cm) Weight: 78.8 kg (173 lb 11.6 oz) IBW/kg (Calculated) : 52.4 HEPARIN  DW (KG): 70.1  Vital Signs: Temp: 98.2 F (36.8 C) (12/16 0320) Temp Source: Oral (12/16 0320) BP: 129/63 (12/15 2300) Pulse Rate: 86 (12/15 2300)  Labs: Recent Labs    02/08/24 1355 02/09/24 0214 02/09/24 1425 02/10/24 0055 02/10/24 0849 02/11/24 0507  HGB 12.3 10.5*  --  10.4*  --  10.6*  HCT 37.8 31.9*  --  31.8*  --  31.9*  PLT 228 228  --  246  --  268  HEPARINUNFRC  --  0.92*   < > 0.54 0.57 0.45  CREATININE 0.77 0.70  --  0.89  --   --    < > = values in this interval not displayed.    Estimated Creatinine Clearance: 45.1 mL/min (by C-G formula based on SCr of 0.89 mg/dL).   Medical History: Past Medical History:  Diagnosis Date   Colon polyp    Fibroid     Medications:  Infusions:   heparin  1,050 Units/hr (02/11/24 0426)    Assessment: 83 yoF presented to ED on 12/13 for chest pain.  CT + bilateral PE.  Dopplers + left DVT.  She also developed afib.  Pharmacy is consulted to dose Heparin  IV.   Today, 02/11/2024: Heparin  level: 0.45, remains therapeutic on heparin  1050 units/hr CBC:  Hgb low/stable at 10.6, Plt WNL No bleeding or complications reported  Goal of Therapy:  Heparin  level 0.3-0.7 units/ml Monitor platelets by anticoagulation protocol: Yes   Plan:  Continue heparin  IV infusion at 1050 units/hr Daily heparin  level and CBC Follow up plans for transition to oral anticoagulation.     Wanda Hasting PharmD, BCPS [facility] main pharmacy [phone] 02/11/2024 7:17 AM    Addendum: Pharmacy is now consulted to transition to apixaban  dosing for PE/DVT and AFib.  Plan: D/C Heparin  Apixaban  10mg  PO BID x7 days, then 5mg  PO BID Pharmacy to provide education and coupon if needed prior to discharge.   Wanda Hasting  PharmD, BCPS [facility] main pharmacy [phone] 02/11/2024 11:15 AM     [1]  Allergies Allergen Reactions   Penicillins

## 2024-02-11 NOTE — Plan of Care (Signed)
°  Problem: Education: Goal: Knowledge of General Education information will improve Description: Including pain rating scale, medication(s)/side effects and non-pharmacologic comfort measures Outcome: Progressing   Problem: Health Behavior/Discharge Planning: Goal: Ability to manage health-related needs will improve Outcome: Progressing   Problem: Clinical Measurements: Goal: Ability to maintain clinical measurements within normal limits will improve Outcome: Progressing Goal: Will remain free from infection Outcome: Progressing Goal: Diagnostic test results will improve Outcome: Progressing Goal: Respiratory complications will improve Outcome: Progressing   Problem: Activity: Goal: Risk for activity intolerance will decrease Outcome: Progressing   Problem: Nutrition: Goal: Adequate nutrition will be maintained Outcome: Progressing   Problem: Safety: Goal: Ability to remain free from injury will improve Outcome: Progressing

## 2024-02-12 ENCOUNTER — Other Ambulatory Visit (HOSPITAL_COMMUNITY): Payer: Self-pay

## 2024-02-12 ENCOUNTER — Telehealth: Payer: Self-pay | Admitting: Internal Medicine

## 2024-02-12 DIAGNOSIS — I4891 Unspecified atrial fibrillation: Secondary | ICD-10-CM | POA: Diagnosis not present

## 2024-02-12 DIAGNOSIS — I2699 Other pulmonary embolism without acute cor pulmonale: Secondary | ICD-10-CM | POA: Diagnosis not present

## 2024-02-12 LAB — CBC
HCT: 32.5 % — ABNORMAL LOW (ref 36.0–46.0)
Hemoglobin: 10.8 g/dL — ABNORMAL LOW (ref 12.0–15.0)
MCH: 28.3 pg (ref 26.0–34.0)
MCHC: 33.2 g/dL (ref 30.0–36.0)
MCV: 85.1 fL (ref 80.0–100.0)
Platelets: 277 K/uL (ref 150–400)
RBC: 3.82 MIL/uL — ABNORMAL LOW (ref 3.87–5.11)
RDW: 13.4 % (ref 11.5–15.5)
WBC: 4.6 K/uL (ref 4.0–10.5)
nRBC: 0 % (ref 0.0–0.2)

## 2024-02-12 LAB — BASIC METABOLIC PANEL WITH GFR
Anion gap: 9 (ref 5–15)
BUN: 21 mg/dL (ref 8–23)
CO2: 25 mmol/L (ref 22–32)
Calcium: 8.7 mg/dL — ABNORMAL LOW (ref 8.9–10.3)
Chloride: 103 mmol/L (ref 98–111)
Creatinine, Ser: 0.86 mg/dL (ref 0.44–1.00)
GFR, Estimated: >60 mL/min (ref >60)
Glucose, Bld: 109 mg/dL — ABNORMAL HIGH (ref 70–99)
Potassium: 4 mmol/L (ref 3.5–5.1)
Sodium: 137 mmol/L (ref 135–145)

## 2024-02-12 LAB — MAGNESIUM: Magnesium: 2.2 mg/dL (ref 1.7–2.4)

## 2024-02-12 MED ORDER — DILTIAZEM HCL ER COATED BEADS 180 MG PO CP24
180.0000 mg | ORAL_CAPSULE | Freq: Every day | ORAL | 0 refills | Status: AC
  Filled 2024-02-12: qty 30, 30d supply, fill #0

## 2024-02-12 MED ORDER — FUROSEMIDE 20 MG PO TABS: 20.0000 mg | ORAL_TABLET | Freq: Every day | ORAL | Status: DC | PRN

## 2024-02-12 MED ORDER — FUROSEMIDE 20 MG PO TABS
20.0000 mg | ORAL_TABLET | Freq: Every day | ORAL | 0 refills | Status: AC | PRN
  Filled 2024-02-12: qty 30, 30d supply, fill #0

## 2024-02-12 MED FILL — Apixaban Tab Starter Pack 5 MG: ORAL | 30 days supply | Qty: 74 | Fill #0 | Status: AC

## 2024-02-12 NOTE — Telephone Encounter (Signed)
 Patient wants a provider switch from Dr. Mona to Dr. Raford.

## 2024-02-12 NOTE — Discharge Summary (Signed)
 Physician Discharge Summary  Sarah Carr FMW:[id number] DOB: 07-Jun-1935 DOA: 02/08/2024  PCP: Patient, No Pcp Per  Admit date: 02/08/2024 Discharge date: 02/12/2024  Admitted From: Home Disposition: Home  Recommendations for Outpatient Follow-up:  Follow up with PCP in 1 week  Outpatient follow-up with cardiology Follow up in ED if symptoms worsen or new appear   Home Health: PT/OT Equipment/Devices: None  Discharge Condition: Stable CODE STATUS: Full Diet recommendation: Heart healthy/fluid restriction of up to 1500 cc a day  Brief/Interim Summary: 88 y.o. female with medical history significant of hyperlipidemia, colon cancer status post right hemicolectomy, glaucoma presented with chest pain.  On presentation, she was found to have A-fib with RVR requiring Cardizem  drip.  proBNP 2077, troponin of 20.  RSV/COVID/influenza PCR negative.  Chest x-ray showed right sided pleural effusion.  CTA PE showed bilateral segmental pulmonary emboli with moderate right pleural effusion and ground glass opacities representing infarct versus infection.  She was started on heparin  drip as well.  Cardiology was consulted.  During the hospitalization, her condition has improved.  She has already been transitioned to oral Cardizem  and Eliquis .  She has diuresed well with IV Lasix .  Cardiology has cleared the patient for discharge home today.  Discharge home today with close outpatient follow-up with PCP and cardiology.  Discharge Diagnoses:   Pulmonary embolism Acute left lower extremity DVT - Imaging as above.  Treated with heparin  drip.  2D echo shows EF of 70 to 75%.  Lower extremity duplex showed acute DVT involving the left posterior tibial veins and left peroneal veins.  Switched to oral Eliquis  on 02/11/2024.  Continue Eliquis  on discharge. - Discharge patient home today.  New onset paroxysmal A-fib with RVR - Rate mostly controlled currently.  Cardiology following.  Patient has been  switched to oral Cardizem  from Cardizem  drip by cardiology.  Continue Eliquis .  Continue Cardizem  on discharge.  Outpatient follow-up with cardiology.   Acute diastolic heart failure Moderate right pleural effusion - Respiratory status currently stable.  No need for thoracentesis. -Treated with IV diuretics as per cardiology: Cardiology has now switched her to Lasix  20 mg daily as needed for swelling.  Outpatient follow-up with cardiology.  Continue salt and fluid restriction.    Glaucoma -Continue home eyedrops    Obesity class I - Outpatient follow-up   Physical deconditioning - Will need home health PT/OT     Discharge Instructions  Discharge Instructions     Amb referral to AFIB Clinic   Complete by: As directed    Diet - low sodium heart healthy   Complete by: As directed    1513ml/day fluid restriction   Increase activity slowly   Complete by: As directed       Allergies as of 02/12/2024       Reactions   Penicillins         Medication List     TAKE these medications    Airsupra 90-80 MCG/ACT Aero Generic drug: Albuterol-Budesonide Inhale 2 puffs into the lungs 4 (four) times daily as needed (shorteness of breath).   Apixaban  Starter Pack (10mg  and 5mg ) Commonly known as: ELIQUIS  STARTER PACK Take as directed on package: start with two-5mg  tablets twice daily for 7 days. On day 8, switch to one-5mg  tablet twice daily.   COQ-10 PO Take by mouth.   diltiazem  180 MG 24 hr capsule Commonly known as: CARDIZEM  CD Take 1 capsule (180 mg total) by mouth daily. Start taking on: February 13, 2024   furosemide   20 MG tablet Commonly known as: LASIX  Take 1 tablet (20 mg total) by mouth daily as needed for fluid or edema. Start taking on: February 13, 2024   Lumigan 0.01 % Soln Generic drug: bimatoprost Place 1 drop into both eyes at bedtime.   multivitamin capsule Take 1 capsule by mouth daily.   TOTAL MEMORY & FOCUS FORMULA PO Take 2 tablets by  mouth in the morning.   Vitamin D 50 MCG (2000 UT) Caps Take 1 capsule by mouth daily.        Follow-up Information     PCP. Schedule an appointment as soon as possible for a visit in 1 week(s).                 Allergies[1]  Consultations: Cardiology   Subjective: Patient seen and examined at bedside.  Feels better and wants to go home today.  Denies worsening shortness of breath, chest pain or fever.  Discharge Exam: Vitals:   02/12/24 0800 02/12/24 0940  BP: (!) 122/59 123/70  Pulse: 80 80  Resp: (!) 21 15  Temp:    SpO2: 96% 94%    General: Pt is alert, awake, not in acute distress.  On room air. Cardiovascular: rate controlled, S1/S2 + Respiratory: bilateral decreased breath sounds at bases Abdominal: Soft, NT, ND, bowel sounds + Extremities: Trace lower extremity edema, no cyanosis    The results of significant diagnostics from this hospitalization (including imaging, microbiology, ancillary and laboratory) are listed below for reference.     Microbiology: Recent Results (from the past 240 hours)  Resp panel by RT-PCR (RSV, Flu A&B, Covid) Anterior Nasal Swab     Status: None   Collection Time: 02/08/24  3:27 PM   Specimen: Anterior Nasal Swab  Result Value Ref Range Status   SARS Coronavirus 2 by RT PCR NEGATIVE NEGATIVE Final    Comment: (NOTE) SARS-CoV-2 target nucleic acids are NOT DETECTED.  The SARS-CoV-2 RNA is generally detectable in upper respiratory specimens during the acute phase of infection. The lowest concentration of SARS-CoV-2 viral copies this assay can detect is 138 copies/mL. A negative result does not preclude SARS-Cov-2 infection and should not be used as the sole basis for treatment or other patient management decisions. A negative result may occur with  improper specimen collection/handling, submission of specimen other than nasopharyngeal swab, presence of viral mutation(s) within the areas targeted by this assay, and  inadequate number of viral copies(<138 copies/mL). A negative result must be combined with clinical observations, patient history, and epidemiological information. The expected result is Negative.  Fact Sheet for Patients:  bloggercourse.com  Fact Sheet for Healthcare Providers:  seriousbroker.it  This test is no t yet approved or cleared by the [place]  FDA and  has been authorized for detection and/or diagnosis of SARS-CoV-2 by FDA under an Emergency Use Authorization (EUA). This EUA will remain  in effect (meaning this test can be used) for the duration of the COVID-19 declaration under Section 564(b)(1) of the Act, 21 U.S.C.section 360bbb-3(b)(1), unless the authorization is terminated  or revoked sooner.       Influenza A by PCR NEGATIVE NEGATIVE Final   Influenza B by PCR NEGATIVE NEGATIVE Final    Comment: (NOTE) The Xpert Xpress SARS-CoV-2/FLU/RSV plus assay is intended as an aid in the diagnosis of influenza from Nasopharyngeal swab specimens and should not be used as a sole basis for treatment. Nasal washings and aspirates are unacceptable for Xpert Xpress SARS-CoV-2/FLU/RSV testing.  Fact Sheet  for Patients: bloggercourse.com  Fact Sheet for Healthcare Providers: seriousbroker.it  This test is not yet approved or cleared by the [place]  FDA and has been authorized for detection and/or diagnosis of SARS-CoV-2 by FDA under an Emergency Use Authorization (EUA). This EUA will remain in effect (meaning this test can be used) for the duration of the COVID-19 declaration under Section 564(b)(1) of the Act, 21 U.S.C. section 360bbb-3(b)(1), unless the authorization is terminated or revoked.     Resp Syncytial Virus by PCR NEGATIVE NEGATIVE Final    Comment: (NOTE) Fact Sheet for Patients: bloggercourse.com  Fact Sheet for Healthcare  Providers: seriousbroker.it  This test is not yet approved or cleared by the [place]  FDA and has been authorized for detection and/or diagnosis of SARS-CoV-2 by FDA under an Emergency Use Authorization (EUA). This EUA will remain in effect (meaning this test can be used) for the duration of the COVID-19 declaration under Section 564(b)(1) of the Act, 21 U.S.C. section 360bbb-3(b)(1), unless the authorization is terminated or revoked.  Performed at [facility], [address], [place], [place] [address]   MRSA Next Gen by PCR, Nasal     Status: None   Collection Time: 02/08/24  5:52 PM   Specimen: Nasal Mucosa; Nasal Swab  Result Value Ref Range Status   MRSA by PCR Next Gen NOT DETECTED NOT DETECTED Final    Comment: (NOTE) The GeneXpert MRSA Assay (FDA approved for NASAL specimens only), is one component of a comprehensive MRSA colonization surveillance program. It is not intended to diagnose MRSA infection nor to guide or monitor treatment for MRSA infections. Test performance is not FDA approved in patients less than 52 years old. Performed at [facility], [address], [place], [place] [address]   Blood culture (routine x 2)     Status: None (Preliminary result)   Collection Time: 02/08/24  7:08 PM   Specimen: BLOOD RIGHT HAND  Result Value Ref Range Status   Specimen Description BLOOD RIGHT HAND  Final   Special Requests   Final    BOTTLES DRAWN AEROBIC ONLY Blood Culture adequate volume   Culture   Final    NO GROWTH 4 DAYS Performed at [facility], [address], [place], [place] [address]    Report Status PENDING  Incomplete  Blood culture (routine x 2)     Status: None (Preliminary result)   Collection Time: 02/08/24  7:08 PM   Specimen: BLOOD RIGHT HAND  Result Value Ref Range Status   Specimen Description   Final    BLOOD RIGHT HAND AEROBIC BOTTLE ONLY Performed at [facility], [address] [place], [place] [address]    Special Requests   Final    Blood Culture adequate volume Performed at [facility], [address] [place], [place] [address]    Culture   Final    NO GROWTH 4 DAYS Performed at [facility], [address], [place], [place] [address]    Report Status PENDING  Incomplete     Labs: BNP (last 3 results) No results for input(s): BNP in the last 8760 hours. Basic Metabolic Panel: Recent Labs  Lab 02/08/24 1355 02/09/24 0214 02/10/24 0055 02/11/24 0507 02/12/24 0310  NA 138 135 136 138 137  K 3.8 3.6 3.5 3.3* 4.0  CL 102 102 102 102 103  CO2 26 23 25 27 25   GLUCOSE 102* 108* 119* 96 109*  BUN 11 10 14 13 21   CREATININE 0.77 0.70 0.89 0.81 0.86  CALCIUM 9.0 8.3* 8.3* 8.4* 8.7*  MG 2.2  --  2.2 2.2 2.2   Liver Function Tests: Recent Labs  Lab 02/08/24 1355 02/09/24 0214  AST 22 19  ALT 23 18  ALKPHOS 67 60  BILITOT 0.7 0.4  PROT 6.9 5.9*  ALBUMIN 3.8 3.3*   No results for input(s): LIPASE, AMYLASE in the last 168 hours. No results for input(s): AMMONIA in the last 168 hours. CBC: Recent Labs  Lab 02/08/24 1355 02/09/24 0214 02/10/24 0055 02/11/24 0507 02/12/24 0310  WBC 7.0 6.5 6.3 5.2 4.6  NEUTROABS 4.3  --   --   --   --   HGB 12.3 10.5* 10.4* 10.6* 10.8*  HCT 37.8 31.9* 31.8* 31.9* 32.5*  MCV 87.5 87.6 86.9 86.0 85.1  PLT 228 228 246 268 277   Cardiac Enzymes: No results for input(s): CKTOTAL, CKMB, CKMBINDEX, TROPONINI in the last 168 hours. BNP: Invalid input(s): POCBNP CBG: No results for input(s): GLUCAP in the last 168 hours. D-Dimer No results for input(s): DDIMER in the last 72 hours. Hgb A1c No results for input(s): HGBA1C in the last 72 hours. Lipid Profile No results for input(s): CHOL, HDL, LDLCALC, TRIG, CHOLHDL, LDLDIRECT in the last 72 hours. Thyroid function studies No results for input(s): TSH, T4TOTAL,  T3FREE, THYROIDAB in the last 72 hours.  Invalid input(s): FREET3 Anemia work up No results for input(s): VITAMINB12, FOLATE, FERRITIN, TIBC, IRON, RETICCTPCT in the last 72 hours. Urinalysis    Component Value Date/Time   COLORURINE YELLOW 04/12/2011 1430   APPEARANCEUR CLOUDY (A) 04/12/2011 1430   LABSPEC >1.030 (H) 04/12/2011 1430   PHURINE 5.5 04/12/2011 1430   GLUCOSEU NEG 04/12/2011 1430   HGBUR SMALL (A) 04/12/2011 1430   BILIRUBINUR neg 04/15/2012 1559   KETONESUR TRACE (A) 04/12/2011 1430   PROTEINUR neg 04/15/2012 1559   PROTEINUR NEG 04/12/2011 1430   UROBILINOGEN negative 04/15/2012 1559   UROBILINOGEN 0.2 04/12/2011 1430   NITRITE neg 04/15/2012 1559   NITRITE NEG 04/12/2011 1430   LEUKOCYTESUR Negative 04/15/2012 1559   Sepsis Labs Recent Labs  Lab 02/09/24 0214 02/10/24 0055 02/11/24 0507 02/12/24 0310  WBC 6.5 6.3 5.2 4.6   Microbiology Recent Results (from the past 240 hours)  Resp panel by RT-PCR (RSV, Flu A&B, Covid) Anterior Nasal Swab     Status: None   Collection Time: 02/08/24  3:27 PM   Specimen: Anterior Nasal Swab  Result Value Ref Range Status   SARS Coronavirus 2 by RT PCR NEGATIVE NEGATIVE Final    Comment: (NOTE) SARS-CoV-2 target nucleic acids are NOT DETECTED.  The SARS-CoV-2 RNA is generally detectable in upper respiratory specimens during the acute phase of infection. The lowest concentration of SARS-CoV-2 viral copies this assay can detect is 138 copies/mL. A negative result does not preclude SARS-Cov-2 infection and should not be used as the sole basis for treatment or other patient management decisions. A negative result may occur with  improper specimen collection/handling, submission of specimen other than nasopharyngeal swab, presence of viral mutation(s) within the areas targeted by this assay, and inadequate number of viral copies(<138 copies/mL). A negative result must be combined with clinical  observations, patient history, and epidemiological information. The expected result is Negative.  Fact Sheet for Patients:  bloggercourse.com  Fact Sheet for Healthcare Providers:  seriousbroker.it  This test is no t yet approved or cleared by the [place]  FDA and  has been authorized for detection and/or diagnosis of SARS-CoV-2 by FDA under an Emergency Use Authorization (EUA). This EUA will remain  in effect (meaning this test can be used) for the duration of the COVID-19 declaration under Section 564(b)(1) of the Act, 21 U.S.C.section 360bbb-3(b)(1), unless the authorization is terminated  or revoked sooner.       Influenza A by PCR NEGATIVE NEGATIVE Final   Influenza B by PCR NEGATIVE NEGATIVE Final    Comment: (NOTE) The Xpert Xpress SARS-CoV-2/FLU/RSV plus assay is intended as an aid in the diagnosis of influenza from Nasopharyngeal swab specimens and should not be used as a sole basis for treatment. Nasal washings and aspirates are unacceptable for Xpert Xpress SARS-CoV-2/FLU/RSV testing.  Fact Sheet for Patients: bloggercourse.com  Fact Sheet for Healthcare Providers: seriousbroker.it  This test is not yet approved or cleared by the [place]  FDA and has been authorized for detection and/or diagnosis of SARS-CoV-2 by FDA under an Emergency Use Authorization (EUA). This EUA will remain in effect (meaning this test can be used) for the duration of the COVID-19 declaration under Section 564(b)(1) of the Act, 21 U.S.C. section 360bbb-3(b)(1), unless the authorization is terminated or revoked.     Resp Syncytial Virus by PCR NEGATIVE NEGATIVE Final    Comment: (NOTE) Fact Sheet for Patients: bloggercourse.com  Fact Sheet for Healthcare Providers: seriousbroker.it  This test is not yet approved or cleared by  the [place]  FDA and has been authorized for detection and/or diagnosis of SARS-CoV-2 by FDA under an Emergency Use Authorization (EUA). This EUA will remain in effect (meaning this test can be used) for the duration of the COVID-19 declaration under Section 564(b)(1) of the Act, 21 U.S.C. section 360bbb-3(b)(1), unless the authorization is terminated or revoked.  Performed at [facility], [address] [place], [place] [address]   MRSA Next Gen by PCR, Nasal     Status: None   Collection Time: 02/08/24  5:52 PM   Specimen: Nasal Mucosa; Nasal Swab  Result Value Ref Range Status   MRSA by PCR Next Gen NOT DETECTED NOT DETECTED Final    Comment: (NOTE) The GeneXpert MRSA Assay (FDA approved for NASAL specimens only), is one component of a comprehensive MRSA colonization surveillance program. It is not intended to diagnose MRSA infection nor to guide or monitor treatment for MRSA infections. Test performance is not FDA approved in patients less than 83 years old. Performed at [facility], [address], [place], [place] [address]   Blood culture (routine x 2)     Status: None (Preliminary result)   Collection Time: 02/08/24  7:08 PM   Specimen: BLOOD RIGHT HAND  Result Value Ref Range Status   Specimen Description BLOOD RIGHT HAND  Final   Special Requests   Final    BOTTLES DRAWN AEROBIC ONLY Blood Culture adequate volume   Culture   Final    NO GROWTH 4 DAYS Performed at [facility], [address] [place], [place] [address]    Report Status PENDING  Incomplete  Blood culture (routine x 2)     Status: None (Preliminary result)   Collection Time: 02/08/24  7:08 PM   Specimen: BLOOD RIGHT HAND  Result Value Ref Range Status   Specimen Description   Final    BLOOD RIGHT HAND AEROBIC BOTTLE ONLY Performed at [facility], [address], [place], [place] [address]    Special Requests   Final  Blood Culture  adequate volume Performed at [facility], [address], [place], [place] [address]    Culture   Final    NO GROWTH 4 DAYS Performed at [facility], [address], [place], [place] [address]    Report Status PENDING  Incomplete     Time coordinating discharge: 35 minutes  SIGNED:   Sophie Mao, MD  [facility] 02/12/2024, 10:47 AM      [1]  Allergies Allergen Reactions   Penicillins

## 2024-02-12 NOTE — Progress Notes (Addendum)
 Progress Note  Patient Name: ALEIGHA GILANI Date of Encounter: 02/12/2024 [facility] Cardiologist: Vinie JAYSON Maxcy, MD   Interval Summary   Patient reports feeling well this AM. No chest pain or shortness of breath. No orthopnea or lower extremity swelling. No palpitations. Maintaining NSR   Vital Signs Vitals:   02/12/24 0100 02/12/24 0400 02/12/24 0500 02/12/24 0711  BP: (!) 118/54 124/62 128/63   Pulse: 86 85    Resp: (!) 25 17    Temp:  98.1 F (36.7 C)  98.3 F (36.8 C)  TempSrc:  Oral  Oral  SpO2: 92% 91%    Weight:      Height:        Intake/Output Summary (Last 24 hours) at 02/12/2024 0938 Last data filed at 02/11/2024 2139 Gross per 24 hour  Intake 141 ml  Output --  Net 141 ml      02/11/2024    5:00 AM 02/10/2024    5:51 AM 02/08/2024    6:01 PM  Last 3 Weights  Weight (lbs) 173 lb 11.6 oz 177 lb 11.1 oz 177 lb 14.6 oz  Weight (kg) 78.8 kg 80.6 kg 80.7 kg      Telemetry/ECG  Nsr with PACs - Personally Reviewed  Physical Exam  GEN: No acute distress.  Sitting upright in the bed  Neck: No JVD Cardiac:  RRR, no murmurs, rubs, or gallops.  Respiratory: Diminished breath sounds in R lung base. Clear to auscultation otherwise. Normal WOB on room air  GI: Soft, nontender, non-distended  MS: No edema in BLE  Assessment & Plan   PAF  - Patient presented with chest pain and shortness of breath. Found to be in afib with RVR. Also found to have multiple bilateral subsegmental PE. Started on IV heparin  and diltiazem . Converted to NSR early AM 12/16  - Echocardiogram this admission showed EF 70-75%, no wall motion abnormalities, severe biatrial dilation, no significant valvular abnormalities. TSH normal. K and mag within normal limits  - Maintaining NSR with PACs on tele  - Continue diltiazem  180 mg daily  - Discussed using a Kardia Mobile to evaluate for afib recurrence  - Continue eliquis  10 mg BID until 12/22. On 12/23, reduce dose to 5 mg  BID. Reviewed monitoring for bleeding on [facility]  - Arranged follow up with cardiology on 1/13   Acute PE/DVT  - RV normal on echocardiogram  - Continue eliquis  as above   Acute on chronic HFpEF  Moderate right-sided pleural effusion  - CTA PE on 12/13 showed moderate right pleural effusion. - pro - BNP 2077 on admission  - She has been on IV lasix  40 mg daily- weight down about 4 lbs. I/Os were not completed yesterday, but she had good recorded output on 12/15 and 12/15. Kidney function stable  - She is euvolemic on exam. Denies shortness of breath, orthopnea  - Received IV lasix  this AM. Stop IV lasix  and start PO lasix  20 mg PRN for weight gain, lower extremity swelling   For questions or updates, please contact [facility] Please consult [email or web address] for contact info under   Signed, Rollo FABIENE Louder, PA-C   Personally seen and examined. Agree with above.  Still continuing to maintain sinus rhythm excellent.  Doing well without any shortness of breath or chest pain.  Subsegmental multilobar PEs being treated with Eliquis .  Converted from IV heparin .  Right pleural effusion has likely improved with IV Lasix .  Feeling better.  P.o. Lasix  20  mg as needed for weight gain would be reasonable for her to have at home.  Okay with discharge.  Oneil Parchment, MD

## 2024-02-12 NOTE — TOC Transition Note (Signed)
 Transition of Care [facility]) - Discharge Note   Patient Details  Name: Sarah Carr MRN: [id number] Date of Birth: 10-06-1935  [facility] CM/SW Contact:  Jon ONEIDA Anon, RN Phone Number: 02/12/2024, 11:27 AM   Clinical Narrative:    Pt will discharge home with [facility] services. Pt states she does not have a preference in [facility] agency. Referrals sent out and [facility] accepted. Will provide [facility] PT/OT services. HH orders are in place. Family to provide transportation at discharge. No further ICM needs at this time. Will sign off.       Final next level of care: Home w Home Health Services Barriers to Discharge: Barriers Resolved   Patient Goals and CMS Choice Patient states their goals for this hospitalization and ongoing recovery are:: Return home with [facility] services CMS Medicare.gov Compare Post Acute Care list provided to:: Patient Choice offered to / list presented to : Patient [place] ownership interest in [facility].provided to:: Patient    Discharge Placement                       Discharge Plan and Services Additional resources added to the After Visit Summary for   In-house Referral: NA Discharge Planning Services: CM Consult Post Acute Care Choice: Home Health          DME Arranged: N/A DME Agency: NA       HH Arranged: PT, OT HH Agency: [facility] Date [facility] Agency Contacted: 02/12/24 Time HH Agency Contacted: 1123 Representative spoke with at [facility] Agency: Accepted through the [employer]  Social Drivers of Health (SDOH) Interventions SDOH Screenings   Food Insecurity: No Food Insecurity (02/08/2024)  Housing: Low Risk (02/08/2024)  Transportation Needs: No Transportation Needs (02/08/2024)  Utilities: Not At Risk (02/08/2024)  Social Connections: Moderately Integrated (02/08/2024)  Tobacco Use: Medium Risk (02/08/2024)     Readmission Risk Interventions    02/12/2024   11:21 AM  Readmission Risk Prevention Plan  Post  Dischage Appt Complete  Medication Screening Complete  Transportation Screening Complete

## 2024-02-12 NOTE — Plan of Care (Signed)
   Problem: Education: Goal: Knowledge of General Education information will improve Description: Including pain rating scale, medication(s)/side effects and non-pharmacologic comfort measures Outcome: Progressing   Problem: Health Behavior/Discharge Planning: Goal: Ability to manage health-related needs will improve Outcome: Progressing   Problem: Clinical Measurements: Goal: Will remain free from infection Outcome: Progressing

## 2024-02-12 NOTE — Progress Notes (Signed)
 Discharge medication delivered to patient at the bedside

## 2024-02-13 LAB — CULTURE, BLOOD (ROUTINE X 2)
Culture: NO GROWTH
Culture: NO GROWTH
Special Requests: ADEQUATE
Special Requests: ADEQUATE

## 2024-02-21 NOTE — Telephone Encounter (Signed)
 Please confirm if this provider switch would be OK.

## 2024-03-05 ENCOUNTER — Ambulatory Visit (INDEPENDENT_AMBULATORY_CARE_PROVIDER_SITE_OTHER): Admitting: Otolaryngology

## 2024-03-05 ENCOUNTER — Encounter (INDEPENDENT_AMBULATORY_CARE_PROVIDER_SITE_OTHER): Payer: Self-pay | Admitting: Otolaryngology

## 2024-03-05 VITALS — BP 109/72 | HR 82 | Ht 63.0 in | Wt 174.0 lb

## 2024-03-05 DIAGNOSIS — H6123 Impacted cerumen, bilateral: Secondary | ICD-10-CM | POA: Diagnosis not present

## 2024-03-05 NOTE — Progress Notes (Signed)
 Patient ID: Sarah Carr, female   DOB: 1936-01-27, 89 y.o.   MRN: [id number]  Procedure: Bilateral cerumen disimpaction.   Indication: Cerumen impaction, resulting in ear discomfort and conductive hearing loss.   Description: The patient is placed supine on the operating table. Under the operating microscope, the right ear canal is examined and is noted to be impacted with cerumen. The cerumen is carefully removed with a combination of suction catheters, cerumen curette, and alligator forceps. After the cerumen removal, the ear canal and tympanic membrane are noted to be normal. No middle ear effusion is noted. The same procedure is then repeated on the left side without exception. The patient tolerated the procedure well.  Follow-up care: The patient will follow up in 6 months.

## 2024-03-09 ENCOUNTER — Encounter: Payer: Self-pay | Admitting: *Deleted

## 2024-03-10 ENCOUNTER — Encounter: Payer: Self-pay | Admitting: Emergency Medicine

## 2024-03-10 ENCOUNTER — Ambulatory Visit: Admitting: Emergency Medicine

## 2024-03-10 VITALS — BP 112/68 | HR 78 | Ht 63.0 in | Wt 174.0 lb

## 2024-03-10 DIAGNOSIS — J90 Pleural effusion, not elsewhere classified: Secondary | ICD-10-CM

## 2024-03-10 DIAGNOSIS — I5032 Chronic diastolic (congestive) heart failure: Secondary | ICD-10-CM

## 2024-03-10 DIAGNOSIS — I2699 Other pulmonary embolism without acute cor pulmonale: Secondary | ICD-10-CM

## 2024-03-10 DIAGNOSIS — I4891 Unspecified atrial fibrillation: Secondary | ICD-10-CM | POA: Diagnosis not present

## 2024-03-10 NOTE — Progress Notes (Signed)
 " Cardiology Office Note:    Date:  03/10/2024  ID:  Sarah Carr, DOB [birth year], MRN [id number] PCP: Rexanne Ingle, MD  [facility] Providers Cardiologist:  Annabella Scarce, MD Cardiology APP:  Rana Lum CROME, NP       Patient Profile:       Chief Complaint: Hospital follow-up History of Present Illness:  Sarah Carr is a 89 y.o. female with visit-pertinent history of colon cancer s/p hemicolectomy, hypothyroidism, hyperlipidemia and paresthesia, DVT/PE, chronic HFpEF, paroxysmal atrial fibrillation  Patient was admitted to the hospital on 01/2024.  She presented with chest pain and was found to be in A-fib with RVR.  CT demonstrated multiple bilateral subsegmental pulmonary emboli with moderate size right pleural effusion.  She was started on IV diltiazem .  She converted to NSR on 12/16.  proBNP was 2077.  She was started on IV Lasix .  Echocardiogram showed LVEF 70-75%, no RWMA, severe biatrial dilation, no significant valvular abnormalities.  She was started on p.o. diltiazem  180 mg daily and transitioned to p.o. Lasix  as needed.  She was discharged on Eliquis  5 mg twice daily.   Discussed the use of AI scribe software for clinical note transcription with the patient, who gave verbal consent to proceed.  History of Present Illness Sarah Carr is an 89 year old female with atrial fibrillation, pulmonary embolism, and DVT who presents for follow-up after recent hospitalization.  Today patient presents to clinic with her daughter.  She is without acute cardiovascular concerns or complaints today.  She denies symptoms concerning for recurrent atrial fibrillation.  She currently has no shortness of breath, chest discomfort, or new physical limitations.  She denies orthopnea, PND, LEE.  Stays relatively active without exertional symptoms currently.  She was prescribed diuretics but has not needed to take them since discharge home.  She does monitor her weight daily and has  had no weight gain.  She performs daily activities without difficulty. She drinks one 12-ounce cup of coffee daily, does not use drugs or alcohol, and is planning international travel in March.  She denies syncope, presyncope, lightheadedness, dizziness, melena, hematochezia, palpitations   Review of systems:  Please see the history of present illness. Carr other systems are reviewed and otherwise negative.      Studies Reviewed:    EKG Interpretation Date/Time:  Tuesday March 10 2024 10:19:37 EST Ventricular Rate:  78 PR Interval:  212 QRS Duration:  72 QT Interval:  416 QTC Calculation: 474 R Axis:   4  Text Interpretation: Sinus rhythm with 1st degree A-V block with Premature atrial complexes Low voltage QRS Cannot rule out Anterior infarct , age undetermined When compared with ECG of 08-Feb-2024 12:08, PREVIOUS ECG IS PRESENT Confirmed by Rana Lum [phone]) on 03/10/2024 12:43:24 PM    Echocardiogram 02/09/2024 1. Left ventricular ejection fraction, by estimation, is 70 to 75%. The  left ventricle has hyperdynamic function. The left ventricle has no  regional wall motion abnormalities. There is mild left ventricular  hypertrophy of the basal-septal segment. Left  ventricular diastolic parameters are indeterminate.   2. Right ventricular systolic function is normal. The right ventricular  size is normal.   3. Left atrial size was severely dilated.   4. Right atrial size was severely dilated.   5. The mitral valve is normal in structure. Trivial mitral valve  regurgitation. No evidence of mitral stenosis.   6. The aortic valve is normal in structure. Aortic valve regurgitation is  not visualized. No  aortic stenosis is present.   7. The inferior vena cava is normal in size with greater than 50%  respiratory variability, suggesting right atrial pressure of 3 mmHg.   Risk Assessment/Calculations:    CHA2DS2-VASc Score =     This indicates a  % annual risk of  stroke. The patient's score is based upon:               Physical Exam:   VS:  BP 112/68 (BP Location: Left Arm, Patient Position: Sitting, Cuff Size: Normal)   Pulse 78   Ht 5' 3 (1.6 m)   Wt 174 lb (78.9 kg)   BMI 30.82 kg/m    Wt Readings from Last 3 Encounters:  03/10/24 174 lb (78.9 kg)  03/05/24 174 lb (78.9 kg)  02/11/24 173 lb 11.6 oz (78.8 kg)    GEN: Well nourished, well developed in no acute distress NECK: No JVD; No carotid bruits CARDIAC: RRR, no murmurs, rubs, gallops RESPIRATORY:  Clear to auscultation without rales, wheezing or rhonchi  ABDOMEN: Soft, non-tender, non-distended EXTREMITIES:  No edema; No acute deformity      Assessment and Plan:  Paroxysmal atrial fibrillation Found to be in A-fib with RVR on 01/2024 in the setting of pulmonary embolism and DVT.  Converted on Cardizem  drip.  Transitioned to oral Cardizem  and Eliquis  Echo 01/2024 with LVEF 70 to 75%, no RWMA, RV normal, biatrial size severely dilated, no significant valve abnormalities.  TSH, K, and mag WNL - Today patient is maintaining sinus rhythm on EKG - She is without symptoms concerning for recurrent atrial fibrillation - Continue Eliquis  5 mg twice daily - Continue diltiazem  180 mg daily - Currently monitoring for A-fib reoccurrence with Kardia mobile device at home - Repeat labs recently done at PCP office  Chronic HFpEF Moderate right-sided pleural effusion CTA PE on 01/2024 showed moderate right pleural effusion with BNP 2077 on recent admission.  Was IV diuresed with good UOP Echo 01/2024 with LVEF 70 to 75%, no RWMA, mild LVH - Pleural effusion likely secondary to PE burden and inflammatory response - Today she appears euvolemic and well compensated on exam.  Denies dyspnea, orthopnea or PND.  Weighing self daily without weight gain and not having to take daily loop diuretic - Continue furosemide  20 mg as needed for weight gain and lower extremity swelling  Pulmonary  embolism Left lower extremity DVT Lower extremity duplex 01/2024 showed acute DVT involving the left posterior tibial veins and left peroneal veins CTA showed bilateral subsegmental pulmonary emboli with moderate right pleural effusion RV normal on echo - Today she is without chest discomfort, dyspnea, or exertional symptoms.  She denies any leg swelling or leg pain - Continue Eliquis  5 mg twice daily - To be managed and monitored per PCP      Dispo:  Return in about 3 months (around 06/08/2024).  Signed, Lum LITTIE Louis, NP  "

## 2024-03-10 NOTE — Patient Instructions (Addendum)
 Medication Instructions:  NO CHANGES  Lab Work: NONE TO BE DONE TODAY.   Testing/Procedures: NONE  Follow-Up: At [facility], you and your health needs are our priority.  As part of our continuing mission to provide you with exceptional heart care, our providers are all part of one team.  This team includes your primary Cardiologist (physician) and Advanced Practice Providers or APPs (Physician Assistants and Nurse Practitioners) who all work together to provide you with the care you need, when you need it.  Your next appointment:   3 MONTHS  Provider:   Vinie JAYSON Maxcy, MD OR Lum Louis, DNP  We recommend signing up for the patient portal called MyChart.  Sign up information is provided on this After Visit Summary.  MyChart is used to connect with patients for Virtual Visits (Telemedicine).  Patients are able to view lab/test results, encounter notes, upcoming appointments, etc.  Non-urgent messages can be sent to your provider as well.   To learn more about what you can do with MyChart, go to [email or web address].   Other Instructions:
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
## 2024-06-11 ENCOUNTER — Ambulatory Visit (HOSPITAL_BASED_OUTPATIENT_CLINIC_OR_DEPARTMENT_OTHER): Admitting: Cardiovascular Disease

## 2024-09-02 ENCOUNTER — Ambulatory Visit (INDEPENDENT_AMBULATORY_CARE_PROVIDER_SITE_OTHER): Admitting: Otolaryngology
